# Patient Record
Sex: Female | Born: 1965 | Race: White | Hispanic: No | Marital: Married | Smoking: Never smoker
Health system: Southern US, Community
[De-identification: ages and names within clinical notes are randomized; demographics above are authoritative.]

## PROBLEM LIST (undated history)

## (undated) DIAGNOSIS — R609 Edema, unspecified: Secondary | ICD-10-CM

## (undated) DIAGNOSIS — I10 Essential (primary) hypertension: Secondary | ICD-10-CM

## (undated) DIAGNOSIS — R310 Gross hematuria: Secondary | ICD-10-CM

## (undated) DIAGNOSIS — E119 Type 2 diabetes mellitus without complications: Secondary | ICD-10-CM

## (undated) DIAGNOSIS — R350 Frequency of micturition: Secondary | ICD-10-CM

## (undated) DIAGNOSIS — K219 Gastro-esophageal reflux disease without esophagitis: Secondary | ICD-10-CM

## (undated) DIAGNOSIS — C801 Malignant (primary) neoplasm, unspecified: Secondary | ICD-10-CM

## (undated) HISTORY — DX: Edema, unspecified: R60.9

## (undated) HISTORY — DX: Malignant (primary) neoplasm, unspecified: C80.1

## (undated) HISTORY — DX: Essential (primary) hypertension: I10

## (undated) HISTORY — PX: OTHER SURGICAL HISTORY: SHX169

## (undated) HISTORY — DX: Gross hematuria: R31.0

## (undated) HISTORY — DX: Frequency of micturition: R35.0

## (undated) HISTORY — DX: Gastro-esophageal reflux disease without esophagitis: K21.9

## (undated) HISTORY — DX: Type 2 diabetes mellitus without complications: E11.9

---

## 1986-01-11 HISTORY — PX: CHOLECYSTECTOMY, LAPAROSCOPIC: SHX56

## 2007-01-12 HISTORY — PX: OTHER SURGICAL HISTORY: SHX169

## 2015-06-25 ENCOUNTER — Ambulatory Visit: Payer: Self-pay | Admitting: Cardiovascular Disease

## 2015-06-25 DIAGNOSIS — R310 Gross hematuria: Secondary | ICD-10-CM

## 2015-06-25 DIAGNOSIS — E119 Type 2 diabetes mellitus without complications: Secondary | ICD-10-CM

## 2015-06-25 DIAGNOSIS — K219 Gastro-esophageal reflux disease without esophagitis: Secondary | ICD-10-CM | POA: Insufficient documentation

## 2015-06-25 DIAGNOSIS — I10 Essential (primary) hypertension: Secondary | ICD-10-CM | POA: Insufficient documentation

## 2015-06-25 DIAGNOSIS — R609 Edema, unspecified: Secondary | ICD-10-CM | POA: Insufficient documentation

## 2015-06-26 ENCOUNTER — Encounter: Payer: Self-pay | Admitting: Cardiovascular Disease

## 2015-06-26 ENCOUNTER — Ambulatory Visit (INDEPENDENT_AMBULATORY_CARE_PROVIDER_SITE_OTHER): Payer: Managed Care, Other (non HMO) | Admitting: Cardiovascular Disease

## 2015-06-26 ENCOUNTER — Telehealth: Payer: Self-pay | Admitting: Cardiovascular Disease

## 2015-06-26 VITALS — BP 150/80 | HR 103 | Ht 67.0 in | Wt 250.0 lb

## 2015-06-26 DIAGNOSIS — R072 Precordial pain: Secondary | ICD-10-CM

## 2015-06-26 DIAGNOSIS — I10 Essential (primary) hypertension: Secondary | ICD-10-CM | POA: Diagnosis not present

## 2015-06-26 NOTE — Patient Instructions (Signed)
Your physician recommends that you schedule a follow-up appointment in:  As needed   Your physician has requested that you have an echocardiogram. Echocardiography is a painless test that uses sound waves to create images of your heart. It provides your doctor with information about the size and shape of your heart and how well your heart's chambers and valves are working. This procedure takes approximately one hour. There are no restrictions for this procedure.     Your physician has requested that you have a lexiscan myoview. For further information please visit [email or web address]. Please follow instruction sheet, as given.       Thank you for choosing [facility] !

## 2015-06-26 NOTE — Progress Notes (Signed)
Cardiology Office Note   Date:  06/26/2015   ID:  Shannon Hawkins, DOB 09/20/1965, MRN [id number]  PCP:  Gar Ponto, MD  Cardiologist:   Jenkins Rouge, MD   No chief complaint on file.     History of Present Illness: Shannon Hawkins is a 50 y.o. female who presents for dyspnea, edema and fatigue. Recent polyuria with elevated BS  Long standing HTN.  Diet ok avoids salt. Has had LE edema for a few months. Recent hematuria to see Dr Jeffie Pollock tomorrow. Note made that UA had protein. She has had relative tachycardia at rest. Dyspnea with exertion. Atypical sharp migrating fleeting chest pain.  Pain not related to exertion or breathing No recent trauma Compliant with meds.    Labs 5/27  A1c 6.7 Cr .76 LDL 118 K 4.1 BNP 15.9 TSH 3.7    Past Medical History  Diagnosis Date  . Gross hematuria   . Urinary frequency   . GERD (gastroesophageal reflux disease)   . HTN (hypertension)   . Cancer ([place])     FAMILY HISTORY OF BLADDER CANCER  . Edema   . DM type 2 (diabetes mellitus, type 2) ([place])     No past surgical history on file.   Current Outpatient Prescriptions  Medication Sig Dispense Refill  . cetirizine-pseudoephedrine (ZYRTEC-D) 5-120 MG tablet Take 1 tablet by mouth daily. Reported on 06/26/2015    . furosemide (LASIX) 20 MG tablet Take 20 mg by mouth daily.    Marland Kitchen losartan (COZAAR) 50 MG tablet Take 50 mg by mouth daily.    . metFORMIN (GLUCOPHAGE) 500 MG tablet Take by mouth daily with breakfast.    . pantoprazole (PROTONIX) 40 MG tablet Take 40 mg by mouth daily.    . potassium chloride (KLOR-CON) 8 MEQ tablet Take 8 mEq by mouth daily.     No current facility-administered medications for this visit.    Allergies:   Duricef    Social History:  The patient  reports that she has never smoked. She does not have any smokeless tobacco history on file.  Lives near [place] but has appt here with roommate and works for [employer].  Sedentary Exercise limited by previous right  hip replacement and left foot surgery   Family History:  The patient's mom has bradycardia has aunt with CAD    ROS:  Please see the history of present illness.   Otherwise, review of systems are positive for none.   All other systems are reviewed and negative.    PHYSICAL EXAM: VS:  BP 150/80 mmHg  Pulse 103  Ht 5\' 7"  (1.702 m)  Wt 250 lb (113.399 kg)  BMI 39.15 kg/m2  SpO2 99% , BMI Body mass index is 39.15 kg/(m^2). Affect appropriate Healthy:  appears stated age 53: normal Neck supple with no adenopathy JVP normal no bruits no thyromegaly Lungs clear with no wheezing and good diaphragmatic motion Heart:  S1/S2 no murmur, no rub, gallop or click PMI normal Abdomen: benighn, BS positve, no tenderness, no AAA no bruit.  No HSM or HJR Distal pulses intact with no bruits No edema Neuro non-focal Skin warm and dry No muscular weakness    EKG:   06/26/15  SR rate 102  Low voltage    Recent Labs: No results found for requested labs within last 365 days.    Lipid Panel No results found for: CHOL, TRIG, HDL, CHOLHDL, VLDL, LDLCALC, LDLDIRECT    Wt Readings from Last 3 Encounters:  06/26/15 250 lb (113.399 kg)      Other studies Reviewed: Additional studies/ records that were reviewed today include: Primary care notes/ labs .    ASSESSMENT AND PLAN:  1.  Chest pain : atypical ECG low voltage due to body habitus cannot walk well do to hip and foot surgeries F/u lexiscan myovue 2. Dyspnea:  Likely due to deconditioning and weight. F/u echo 3. Edema:  BNP normal not likely related to heart ? Nephrotic syndrome encouraged her to get 24 hr Urine protein with urologist or primary 4. Tachycardia:  No arrythmia normal P wave check echo TSH normal likely related to obesity  5. DM  Discussed low carb diet.  Target hemoglobin A1c is 6.5 or less.  Continue current medications. 6. HTN:  Well controlled.  Continue current medications and low sodium Dash type diet.       Current medicines are reviewed at length with the patient today.  The patient does not have concerns regarding medicines.  The following changes have been made:  no change  Labs/ tests ordered today include: Lexiscan and echo needs 24 hr urine protein   No orders of the defined types were placed in this encounter.     Disposition:   FU with me PRN      Signed, Jenkins Rouge, MD  06/26/2015 3:01 PM    [facility] [address], [place], [place]  [address] Phone: [phone]; Fax: [phone]

## 2015-06-26 NOTE — Telephone Encounter (Signed)
Please schedule following test same day for patient lexi myo and echo

## 2015-06-27 ENCOUNTER — Ambulatory Visit (INDEPENDENT_AMBULATORY_CARE_PROVIDER_SITE_OTHER): Payer: Managed Care, Other (non HMO) | Admitting: Urology

## 2015-06-27 ENCOUNTER — Other Ambulatory Visit (HOSPITAL_COMMUNITY)
Admission: AD | Admit: 2015-06-27 | Discharge: 2015-06-27 | Disposition: A | Discharge status: Home / Self Care | Payer: Managed Care, Other (non HMO) | Source: Other Acute Inpatient Hospital | Attending: Urology | Admitting: Urology

## 2015-06-27 DIAGNOSIS — C679 Malignant neoplasm of bladder, unspecified: Secondary | ICD-10-CM | POA: Diagnosis not present

## 2015-06-27 DIAGNOSIS — R808 Other proteinuria: Secondary | ICD-10-CM | POA: Diagnosis not present

## 2015-06-27 DIAGNOSIS — N3946 Mixed incontinence: Secondary | ICD-10-CM

## 2015-06-27 DIAGNOSIS — R350 Frequency of micturition: Secondary | ICD-10-CM | POA: Diagnosis not present

## 2015-06-27 DIAGNOSIS — R3121 Asymptomatic microscopic hematuria: Secondary | ICD-10-CM

## 2015-06-27 LAB — URINALYSIS, ROUTINE W REFLEX MICROSCOPIC
Bilirubin Urine: NEGATIVE
Glucose, UA: NEGATIVE mg/dL
HGB URINE DIPSTICK: NEGATIVE
Ketones, ur: NEGATIVE mg/dL
Leukocytes, UA: NEGATIVE
NITRITE: NEGATIVE
Protein, ur: NEGATIVE mg/dL
pH: 5.5 (ref 5.0–8.0)

## 2015-07-01 ENCOUNTER — Other Ambulatory Visit: Payer: Self-pay | Admitting: Urology

## 2015-07-01 DIAGNOSIS — R3121 Asymptomatic microscopic hematuria: Secondary | ICD-10-CM

## 2015-07-09 ENCOUNTER — Encounter (HOSPITAL_COMMUNITY): Admission: RE | Admit: 2015-07-09 | Payer: Managed Care, Other (non HMO) | Source: Ambulatory Visit

## 2015-07-09 ENCOUNTER — Ambulatory Visit (HOSPITAL_COMMUNITY): Payer: Managed Care, Other (non HMO) | Attending: Cardiovascular Disease

## 2015-07-09 ENCOUNTER — Encounter (HOSPITAL_COMMUNITY): Payer: Managed Care, Other (non HMO)

## 2015-07-09 ENCOUNTER — Inpatient Hospital Stay (HOSPITAL_COMMUNITY): Admission: RE | Admit: 2015-07-09 | Payer: Managed Care, Other (non HMO) | Source: Ambulatory Visit

## 2015-07-09 ENCOUNTER — Encounter (HOSPITAL_COMMUNITY): Payer: Managed Care, Other (non HMO) | Attending: Cardiovascular Disease

## 2015-08-08 ENCOUNTER — Other Ambulatory Visit: Payer: Self-pay | Admitting: Urology

## 2015-08-08 DIAGNOSIS — D4102 Neoplasm of uncertain behavior of left kidney: Secondary | ICD-10-CM

## 2015-08-14 ENCOUNTER — Encounter (HOSPITAL_COMMUNITY): Payer: Self-pay | Admitting: Radiology

## 2015-08-14 ENCOUNTER — Ambulatory Visit (HOSPITAL_COMMUNITY)
Admission: RE | Admit: 2015-08-14 | Discharge: 2015-08-14 | Disposition: A | Discharge status: Home / Self Care | Payer: Managed Care, Other (non HMO) | Source: Ambulatory Visit | Attending: Urology | Admitting: Urology

## 2015-08-14 DIAGNOSIS — D4102 Neoplasm of uncertain behavior of left kidney: Secondary | ICD-10-CM | POA: Diagnosis not present

## 2015-08-14 DIAGNOSIS — N281 Cyst of kidney, acquired: Secondary | ICD-10-CM | POA: Diagnosis not present

## 2015-08-14 LAB — POCT I-STAT CREATININE: CREATININE: 0.8 mg/dL (ref 0.44–1.00)

## 2015-08-14 MED ORDER — GADOBENATE DIMEGLUMINE 529 MG/ML IV SOLN
20.0000 mL | Freq: Once | INTRAVENOUS | Status: AC | PRN
  Administered 2015-08-14: 20 mL via INTRAVENOUS

## 2015-08-15 ENCOUNTER — Ambulatory Visit (INDEPENDENT_AMBULATORY_CARE_PROVIDER_SITE_OTHER): Payer: Managed Care, Other (non HMO) | Admitting: Urology

## 2015-08-15 DIAGNOSIS — R3121 Asymptomatic microscopic hematuria: Secondary | ICD-10-CM

## 2015-08-15 DIAGNOSIS — Q618 Other cystic kidney diseases: Secondary | ICD-10-CM | POA: Diagnosis not present

## 2015-08-26 ENCOUNTER — Ambulatory Visit (INDEPENDENT_AMBULATORY_CARE_PROVIDER_SITE_OTHER): Payer: Managed Care, Other (non HMO) | Admitting: Urology

## 2015-08-26 ENCOUNTER — Inpatient Hospital Stay (HOSPITAL_COMMUNITY): Admission: RE | Admit: 2015-08-26 | Payer: Managed Care, Other (non HMO) | Source: Ambulatory Visit

## 2015-08-26 ENCOUNTER — Encounter (HOSPITAL_COMMUNITY): Payer: Self-pay

## 2015-08-26 ENCOUNTER — Encounter (HOSPITAL_COMMUNITY)
Admission: RE | Admit: 2015-08-26 | Discharge: 2015-08-26 | Disposition: A | Discharge status: Home / Self Care | Payer: Managed Care, Other (non HMO) | Source: Ambulatory Visit | Attending: Cardiovascular Disease | Admitting: Cardiovascular Disease

## 2015-08-26 ENCOUNTER — Ambulatory Visit (HOSPITAL_COMMUNITY)
Admission: RE | Admit: 2015-08-26 | Discharge: 2015-08-26 | Disposition: A | Discharge status: Home / Self Care | Payer: Managed Care, Other (non HMO) | Source: Ambulatory Visit | Attending: Cardiovascular Disease | Admitting: Cardiovascular Disease

## 2015-08-26 DIAGNOSIS — I259 Chronic ischemic heart disease, unspecified: Secondary | ICD-10-CM | POA: Diagnosis not present

## 2015-08-26 DIAGNOSIS — E669 Obesity, unspecified: Secondary | ICD-10-CM | POA: Diagnosis not present

## 2015-08-26 DIAGNOSIS — Z6839 Body mass index (BMI) 39.0-39.9, adult: Secondary | ICD-10-CM | POA: Diagnosis not present

## 2015-08-26 DIAGNOSIS — K219 Gastro-esophageal reflux disease without esophagitis: Secondary | ICD-10-CM | POA: Insufficient documentation

## 2015-08-26 DIAGNOSIS — I119 Hypertensive heart disease without heart failure: Secondary | ICD-10-CM | POA: Insufficient documentation

## 2015-08-26 DIAGNOSIS — I358 Other nonrheumatic aortic valve disorders: Secondary | ICD-10-CM | POA: Diagnosis not present

## 2015-08-26 DIAGNOSIS — R931 Abnormal findings on diagnostic imaging of heart and coronary circulation: Secondary | ICD-10-CM | POA: Insufficient documentation

## 2015-08-26 DIAGNOSIS — R3915 Urgency of urination: Secondary | ICD-10-CM | POA: Diagnosis not present

## 2015-08-26 DIAGNOSIS — R350 Frequency of micturition: Secondary | ICD-10-CM | POA: Diagnosis not present

## 2015-08-26 DIAGNOSIS — R072 Precordial pain: Secondary | ICD-10-CM

## 2015-08-26 DIAGNOSIS — E119 Type 2 diabetes mellitus without complications: Secondary | ICD-10-CM | POA: Insufficient documentation

## 2015-08-26 DIAGNOSIS — I10 Essential (primary) hypertension: Secondary | ICD-10-CM

## 2015-08-26 DIAGNOSIS — R079 Chest pain, unspecified: Secondary | ICD-10-CM | POA: Diagnosis not present

## 2015-08-26 DIAGNOSIS — I059 Rheumatic mitral valve disease, unspecified: Secondary | ICD-10-CM | POA: Insufficient documentation

## 2015-08-26 LAB — NM MYOCAR MULTI W/SPECT W/WALL MOTION / EF
CHL CUP RESTING HR STRESS: 81 {beats}/min
CSEPPHR: 113 {beats}/min
LV dias vol: 63 mL (ref 46–106)
LVSYSVOL: 14 mL
RATE: 0.3
SDS: 11
SRS: 7
SSS: 18
TID: 0.99

## 2015-08-26 LAB — ECHOCARDIOGRAM COMPLETE
AVLVOTPG: 5 mmHg
CHL CUP MV DEC (S): 243
CHL CUP STROKE VOLUME: 37 mL
EERAT: 6.23
EWDT: 243 ms
FS: 32 % (ref 28–44)
IVS/LV PW RATIO, ED: 0.96
LA diam end sys: 26 mm
LA diam index: 1.1 cm/m2
LA vol A4C: 43.3 ml
LA vol: 44.3 mL
LASIZE: 26 mm
LAVOLIN: 18.7 mL/m2
LV E/e' medial: 6.23
LV PW d: 11.4 mm — AB (ref 0.6–1.1)
LV SIMPSON'S DISK: 64
LV TDI E'MEDIAL: 7.51
LV dias vol index: 24 mL/m2
LV e' LATERAL: 13.1 cm/s
LVDIAVOL: 58 mL (ref 46–106)
LVEEAVG: 6.23
LVOT VTI: 23.3 cm
LVOT area: 2.54 cm2
LVOTD: 18 mm
LVOTPV: 110 cm/s
LVOTSV: 59 mL
LVSYSVOL: 21 mL (ref 14–42)
LVSYSVOLIN: 9 mL/m2
MV Peak grad: 3 mmHg
MV pk A vel: 77.8 m/s
MV pk E vel: 81.6 m/s
RV LATERAL S' VELOCITY: 12.9 cm/s
RV TAPSE: 24.9 mm
TDI e' lateral: 13.1

## 2015-08-26 MED ORDER — REGADENOSON 0.4 MG/5ML IV SOLN
INTRAVENOUS | Status: AC
  Administered 2015-08-26: 0.4 mg via INTRAVENOUS
  Filled 2015-08-26: qty 5

## 2015-08-26 MED ORDER — TECHNETIUM TC 99M TETROFOSMIN IV KIT
10.0000 | PACK | Freq: Once | INTRAVENOUS | Status: AC | PRN
  Administered 2015-08-26: 10 via INTRAVENOUS

## 2015-08-26 MED ORDER — PERFLUTREN LIPID MICROSPHERE
1.0000 mL | INTRAVENOUS | Status: AC | PRN
  Administered 2015-08-26: 2 mL via INTRAVENOUS
  Administered 2015-08-26: 1 mL via INTRAVENOUS

## 2015-08-26 MED ORDER — TECHNETIUM TC 99M TETROFOSMIN IV KIT
30.0000 | PACK | Freq: Once | INTRAVENOUS | Status: AC | PRN
  Administered 2015-08-26: 30 via INTRAVENOUS

## 2015-08-26 MED ORDER — SODIUM CHLORIDE 0.9% FLUSH
INTRAVENOUS | Status: AC
  Administered 2015-08-26: 10 mL via INTRAVENOUS
  Filled 2015-08-26: qty 10

## 2015-08-26 NOTE — Progress Notes (Signed)
Echo completed. IV to left AC in place with good blood return. IV flushed with saline and remains in place for nuclear stress test later today. Coban applied to IV site. Tolerated well.

## 2015-08-26 NOTE — Progress Notes (Signed)
*  PRELIMINARY RESULTS* Echocardiogram 2D Echocardiogram has been performed with Definity.  Samuel Germany 08/26/2015, 9:46 AM

## 2015-08-26 NOTE — Progress Notes (Signed)
#  20 IV started left AC x1 stick, saline locked. drsg to site. Tolerated well.

## 2015-08-27 ENCOUNTER — Other Ambulatory Visit: Payer: Self-pay | Admitting: Cardiovascular Disease

## 2015-08-27 ENCOUNTER — Telehealth: Payer: Self-pay | Admitting: Cardiovascular Disease

## 2015-08-27 DIAGNOSIS — I2583 Coronary atherosclerosis due to lipid rich plaque: Principal | ICD-10-CM

## 2015-08-27 DIAGNOSIS — I2510 Atherosclerotic heart disease of native coronary artery without angina pectoris: Secondary | ICD-10-CM

## 2015-08-27 DIAGNOSIS — Z01812 Encounter for preprocedural laboratory examination: Secondary | ICD-10-CM

## 2015-08-27 NOTE — Telephone Encounter (Signed)
Spoke with patient about her instructions for heart cath.  Mailed instructions as well. Patient will come in on 09/08/15 for pre- procedure lab work. Patient verbalized understanding.

## 2015-08-27 NOTE — Telephone Encounter (Signed)
Follow Up:     . Returning your call, 

## 2015-08-27 NOTE — Telephone Encounter (Signed)
New Message ° °Pt is returning Pam's call.  ° °Please follow up with pt. Thanks! °

## 2015-08-27 NOTE — Telephone Encounter (Signed)
Left message for patient to call back  

## 2015-09-08 ENCOUNTER — Other Ambulatory Visit: Payer: Managed Care, Other (non HMO) | Admitting: *Deleted

## 2015-09-08 DIAGNOSIS — Z01812 Encounter for preprocedural laboratory examination: Secondary | ICD-10-CM

## 2015-09-08 LAB — CBC WITH DIFFERENTIAL/PLATELET
Basophils Absolute: 0 cells/uL (ref 0–200)
Basophils Relative: 0 %
EOS PCT: 2 %
Eosinophils Absolute: 222 cells/uL (ref 15–500)
HCT: 37.9 % (ref 35.0–45.0)
HEMOGLOBIN: 12.6 g/dL (ref 11.7–15.5)
LYMPHS ABS: 2442 {cells}/uL (ref 850–3900)
Lymphocytes Relative: 22 %
MCH: 28.4 pg (ref 27.0–33.0)
MCHC: 33.2 g/dL (ref 32.0–36.0)
MCV: 85.4 fL (ref 80.0–100.0)
MONOS PCT: 5 %
MPV: 9.2 fL (ref 7.5–12.5)
Monocytes Absolute: 555 cells/uL (ref 200–950)
NEUTROS ABS: 7881 {cells}/uL — AB (ref 1500–7800)
NEUTROS PCT: 71 %
PLATELETS: 434 10*3/uL — AB (ref 140–400)
RBC: 4.44 MIL/uL (ref 3.80–5.10)
RDW: 13.6 % (ref 11.0–15.0)
WBC: 11.1 10*3/uL — AB (ref 3.8–10.8)

## 2015-09-08 LAB — BASIC METABOLIC PANEL
BUN: 15 mg/dL (ref 7–25)
CALCIUM: 9.4 mg/dL (ref 8.6–10.2)
CO2: 26 mmol/L (ref 20–31)
CREATININE: 0.78 mg/dL (ref 0.50–1.10)
Chloride: 104 mmol/L (ref 98–110)
Glucose, Bld: 115 mg/dL — ABNORMAL HIGH (ref 65–99)
Potassium: 4.1 mmol/L (ref 3.5–5.3)
SODIUM: 140 mmol/L (ref 135–146)

## 2015-09-08 LAB — PROTIME-INR
INR: 0.9
PROTHROMBIN TIME: 9.8 s (ref 9.0–11.5)

## 2015-09-09 ENCOUNTER — Telehealth: Payer: Self-pay | Admitting: Cardiovascular Disease

## 2015-09-09 NOTE — Telephone Encounter (Signed)
Called patient with lab results.  

## 2015-09-09 NOTE — Telephone Encounter (Signed)
Follow Up:; ° ° °Returning your call. °

## 2015-09-12 ENCOUNTER — Encounter (HOSPITAL_COMMUNITY): Payer: Self-pay | Admitting: *Deleted

## 2015-09-12 ENCOUNTER — Encounter (HOSPITAL_COMMUNITY)
Admission: RE | Disposition: A | Discharge status: Home / Self Care | Payer: Self-pay | Source: Ambulatory Visit | Attending: Cardiovascular Disease

## 2015-09-12 ENCOUNTER — Ambulatory Visit (HOSPITAL_COMMUNITY)
Admission: RE | Admit: 2015-09-12 | Discharge: 2015-09-12 | Disposition: A | Discharge status: Home / Self Care | Payer: Managed Care, Other (non HMO) | Source: Ambulatory Visit | Attending: Cardiovascular Disease | Admitting: Cardiovascular Disease

## 2015-09-12 DIAGNOSIS — I2583 Coronary atherosclerosis due to lipid rich plaque: Secondary | ICD-10-CM

## 2015-09-12 DIAGNOSIS — I10 Essential (primary) hypertension: Secondary | ICD-10-CM | POA: Insufficient documentation

## 2015-09-12 DIAGNOSIS — R9439 Abnormal result of other cardiovascular function study: Secondary | ICD-10-CM | POA: Diagnosis present

## 2015-09-12 DIAGNOSIS — Z7984 Long term (current) use of oral hypoglycemic drugs: Secondary | ICD-10-CM | POA: Diagnosis not present

## 2015-09-12 DIAGNOSIS — R310 Gross hematuria: Secondary | ICD-10-CM | POA: Insufficient documentation

## 2015-09-12 DIAGNOSIS — R079 Chest pain, unspecified: Secondary | ICD-10-CM | POA: Diagnosis present

## 2015-09-12 DIAGNOSIS — E669 Obesity, unspecified: Secondary | ICD-10-CM | POA: Insufficient documentation

## 2015-09-12 DIAGNOSIS — Z96641 Presence of right artificial hip joint: Secondary | ICD-10-CM | POA: Insufficient documentation

## 2015-09-12 DIAGNOSIS — Z6841 Body Mass Index (BMI) 40.0 and over, adult: Secondary | ICD-10-CM | POA: Diagnosis not present

## 2015-09-12 DIAGNOSIS — K219 Gastro-esophageal reflux disease without esophagitis: Secondary | ICD-10-CM | POA: Diagnosis not present

## 2015-09-12 DIAGNOSIS — I2510 Atherosclerotic heart disease of native coronary artery without angina pectoris: Secondary | ICD-10-CM | POA: Diagnosis not present

## 2015-09-12 DIAGNOSIS — E119 Type 2 diabetes mellitus without complications: Secondary | ICD-10-CM | POA: Insufficient documentation

## 2015-09-12 HISTORY — PX: CARDIAC CATHETERIZATION: SHX172

## 2015-09-12 LAB — GLUCOSE, CAPILLARY
GLUCOSE-CAPILLARY: 111 mg/dL — AB (ref 65–99)
GLUCOSE-CAPILLARY: 108 mg/dL — AB (ref 65–99)

## 2015-09-12 SURGERY — LEFT HEART CATH AND CORONARY ANGIOGRAPHY

## 2015-09-12 MED ORDER — SODIUM CHLORIDE 0.9 % IV SOLN: 250.0000 mL | INTRAVENOUS | Status: DC | PRN

## 2015-09-12 MED ORDER — IOPAMIDOL (ISOVUE-370) INJECTION 76%
INTRAVENOUS | Status: AC
  Filled 2015-09-12: qty 100

## 2015-09-12 MED ORDER — SODIUM CHLORIDE 0.9% FLUSH: 3.0000 mL | INTRAVENOUS | Status: DC | PRN

## 2015-09-12 MED ORDER — FENTANYL CITRATE (PF) 100 MCG/2ML IJ SOLN
INTRAMUSCULAR | Status: AC
  Filled 2015-09-12: qty 2

## 2015-09-12 MED ORDER — LIDOCAINE HCL (PF) 1 % IJ SOLN
INTRAMUSCULAR | Status: DC | PRN
  Administered 2015-09-12: 2 mL via INTRADERMAL

## 2015-09-12 MED ORDER — ATORVASTATIN CALCIUM 40 MG PO TABS: 40.0000 mg | ORAL_TABLET | Freq: Every day | ORAL | 5 refills | Status: DC

## 2015-09-12 MED ORDER — SODIUM CHLORIDE 0.9 % WEIGHT BASED INFUSION
1.0000 mL/kg/h | INTRAVENOUS | Status: DC
  Administered 2015-09-12: 1 mL/kg/h via INTRAVENOUS

## 2015-09-12 MED ORDER — VERAPAMIL HCL 2.5 MG/ML IV SOLN
INTRAVENOUS | Status: DC | PRN
  Administered 2015-09-12: 10 mL via INTRA_ARTERIAL

## 2015-09-12 MED ORDER — SODIUM CHLORIDE 0.9% FLUSH: 3.0000 mL | Freq: Two times a day (BID) | INTRAVENOUS | Status: DC

## 2015-09-12 MED ORDER — HEPARIN (PORCINE) IN NACL 2-0.9 UNIT/ML-% IJ SOLN
INTRAMUSCULAR | Status: DC | PRN
  Administered 2015-09-12: 1000 mL

## 2015-09-12 MED ORDER — MIDAZOLAM HCL 2 MG/2ML IJ SOLN
INTRAMUSCULAR | Status: AC
  Filled 2015-09-12: qty 2

## 2015-09-12 MED ORDER — FENTANYL CITRATE (PF) 100 MCG/2ML IJ SOLN
INTRAMUSCULAR | Status: DC | PRN
  Administered 2015-09-12: 50 ug via INTRAVENOUS
  Administered 2015-09-12: 25 ug via INTRAVENOUS

## 2015-09-12 MED ORDER — VERAPAMIL HCL 2.5 MG/ML IV SOLN
INTRAVENOUS | Status: AC
  Filled 2015-09-12: qty 2

## 2015-09-12 MED ORDER — ASPIRIN 81 MG PO CHEW
CHEWABLE_TABLET | ORAL | Status: AC
  Filled 2015-09-12: qty 1

## 2015-09-12 MED ORDER — LIDOCAINE HCL (PF) 1 % IJ SOLN
INTRAMUSCULAR | Status: AC
  Filled 2015-09-12: qty 30

## 2015-09-12 MED ORDER — HEPARIN SODIUM (PORCINE) 1000 UNIT/ML IJ SOLN
INTRAMUSCULAR | Status: DC | PRN
  Administered 2015-09-12: 5000 [IU] via INTRAVENOUS

## 2015-09-12 MED ORDER — SODIUM CHLORIDE 0.9 % WEIGHT BASED INFUSION
3.0000 mL/kg/h | INTRAVENOUS | Status: DC
  Administered 2015-09-12: 3 mL/kg/h via INTRAVENOUS

## 2015-09-12 MED ORDER — HEPARIN (PORCINE) IN NACL 2-0.9 UNIT/ML-% IJ SOLN
INTRAMUSCULAR | Status: AC
  Filled 2015-09-12: qty 1000

## 2015-09-12 MED ORDER — ASPIRIN 81 MG PO CHEW
81.0000 mg | CHEWABLE_TABLET | ORAL | Status: AC
  Administered 2015-09-12: 81 mg via ORAL

## 2015-09-12 MED ORDER — SODIUM CHLORIDE 0.9 % IV SOLN: INTRAVENOUS | Status: DC

## 2015-09-12 MED ORDER — SODIUM CHLORIDE 0.9 % IV SOLN
250.0000 mL | INTRAVENOUS | Status: DC | PRN
Start: 2015-09-12 — End: 2015-09-12

## 2015-09-12 MED ORDER — IOPAMIDOL (ISOVUE-370) INJECTION 76%
INTRAVENOUS | Status: DC | PRN
  Administered 2015-09-12: 75 mL via INTRAVENOUS

## 2015-09-12 MED ORDER — MIDAZOLAM HCL 2 MG/2ML IJ SOLN
INTRAMUSCULAR | Status: DC | PRN
  Administered 2015-09-12 (×2): 1 mg via INTRAVENOUS

## 2015-09-12 SURGICAL SUPPLY — 11 items
CATH INFINITI 5FR ANG PIGTAIL (CATHETERS) ×2 IMPLANT
CATH OPTITORQUE TIG 4.0 5F (CATHETERS) ×2 IMPLANT
DEVICE RAD COMP TR BAND LRG (VASCULAR PRODUCTS) ×2 IMPLANT
GLIDESHEATH SLEND SS 6F .021 (SHEATH) ×2 IMPLANT
KIT HEART LEFT (KITS) ×2 IMPLANT
PACK CARDIAC CATHETERIZATION (CUSTOM PROCEDURE TRAY) ×2 IMPLANT
SYR MEDRAD MARK V 150ML (SYRINGE) ×2 IMPLANT
TRANSDUCER W/STOPCOCK (MISCELLANEOUS) ×2 IMPLANT
TUBING CIL FLEX 10 FLL-RA (TUBING) ×2 IMPLANT
WIRE HI TORQ VERSACORE-J 145CM (WIRE) ×2 IMPLANT
WIRE SAFE-T 1.5MM-J .035X260CM (WIRE) ×2 IMPLANT

## 2015-09-12 NOTE — Discharge Instructions (Signed)
°  Hold Metformin for 48 hours. May resume Mon. Morning.   Radial Site Care Refer to this sheet in the next few weeks. These instructions provide you with information about caring for yourself after your procedure. Your health care provider may also give you more specific instructions. Your treatment has been planned according to current medical practices, but problems sometimes occur. Call your health care provider if you have any problems or questions after your procedure. WHAT TO EXPECT AFTER THE PROCEDURE After your procedure, it is typical to have the following:  Bruising at the radial site that usually fades within 1-2 weeks.  Blood collecting in the tissue (hematoma) that may be painful to the touch. It should usually decrease in size and tenderness within 1-2 weeks. HOME CARE INSTRUCTIONS  Take medicines only as directed by your health care provider.  You may shower 24-48 hours after the procedure or as directed by your health care provider. Remove the bandage (dressing) and gently wash the site with plain soap and water. Pat the area dry with a clean towel. Do not rub the site, because this may cause bleeding.  Do not take baths, swim, or use a hot tub until your health care provider approves.  Check your insertion site every day for redness, swelling, or drainage.  Do not apply powder or lotion to the site.  Do not flex or bend the affected arm for 24 hours or as directed by your health care provider.  Do not push or pull heavy objects with the affected arm for 24 hours or as directed by your health care provider.  Do not lift over 10 lb (4.5 kg) for 5 days after your procedure or as directed by your health care provider.  Ask your health care provider when it is okay to:  Return to work or school.  Resume usual physical activities or sports.  Resume sexual activity.  Do not drive home if you are discharged the same day as the procedure. Have someone else drive  you.  You may drive 24 hours after the procedure unless otherwise instructed by your health care provider.  Do not operate machinery or power tools for 24 hours after the procedure.  If your procedure was done as an outpatient procedure, which means that you went home the same day as your procedure, a responsible adult should be with you for the first 24 hours after you arrive home.  Keep all follow-up visits as directed by your health care provider. This is important. SEEK MEDICAL CARE IF:  You have a fever.  You have chills.  You have increased bleeding from the radial site. Hold pressure on the site. SEEK IMMEDIATE MEDICAL CARE IF:  You have unusual pain at the radial site.  You have redness, warmth, or swelling at the radial site.  You have drainage (other than a small amount of blood on the dressing) from the radial site.  The radial site is bleeding, and the bleeding does not stop after 30 minutes of holding steady pressure on the site.  Your arm or hand becomes pale, cool, tingly, or numb.   This information is not intended to replace advice given to you by your health care provider. Make sure you discuss any questions you have with your health care provider.   Document Released: 01/30/2010 Document Revised: 01/18/2014 Document Reviewed: 07/16/2013 Elsevier Interactive Patient Education [employer].

## 2015-09-12 NOTE — H&P (Signed)
Cardiology History & Physical    Patient ID: Shannon Hawkins MRN: [id number], DOB: 1965/07/05 Date of Encounter: 09/12/2015, 11:07 AM Primary Physician: Gar Ponto, MD Primary Cardiologist: Jenkins Rouge, MD  Chief Complaint: Chest pain Reason for Catheterization: Chest pain  Requesting MD: Jenkins Rouge, MD  HPI: 50 year old woman with history of diabetes mellitus, hypertension, hematuria being evaluated by urology, and obesity, who was evaluated by Dr. Johnsie Cancel in June for episodic chest pain accompanied by shortness of breath and dizziness.  She notes these episodes have been happening sporadically for more than a year but seem to be increasing in frequency, now happening several times per week.  She describes her pain as a central stabbing with accompanying palpitations, dyspnea, and lightheadedness.  They last for several minutes and resolve spontaneously.  She does not know of any clear precipitants, with the symptoms frequently occurring when she is resting.  She underwent echocardiogram that revealed no significant abnormalities.  However, subsequent myocardial perfusion stress test this month revealed possible anterior anterolateral infarct with moderate peri-infarct ischemia as well as mild inferior wall ischemia with a normal EF.  Given her constellation of symptoms and abnormal stress test, the patient has been referred for left heart catheterization and possible PCI.  Of note, the patient reports to remote cardiac catheterizations that were normal by her report.  Past Medical History:  Diagnosis Date  . Cancer ([place])    FAMILY HISTORY OF BLADDER CANCER  . DM type 2 (diabetes mellitus, type 2) ([place])   . Edema   . GERD (gastroesophageal reflux disease)   . Gross hematuria   . HTN (hypertension)   . Urinary frequency      Surgical History:  Right total hip arthroplasty, right elbow surgery, and hysterectomy  Home Meds: Prior to Admission medications   Medication Sig Start Date  Brenton Joines Date Taking? Authorizing Provider  cetirizine-pseudoephedrine (ZYRTEC-D) 5-120 MG tablet Take 1 tablet by mouth daily. Reported on 06/26/2015   Yes Historical Provider, MD  diazepam (VALIUM) 5 MG tablet Take 5 mg by mouth every evening.   Yes Historical Provider, MD  furosemide (LASIX) 20 MG tablet Take 20 mg by mouth daily.   Yes Historical Provider, MD  losartan (COZAAR) 50 MG tablet Take 50 mg by mouth daily after breakfast.    Yes Historical Provider, MD  metFORMIN (GLUCOPHAGE) 500 MG tablet Take by mouth daily with breakfast.   Yes Historical Provider, MD  Meth-Hyo-M Bl-Na Phos-Ph Sal (URIBEL) 118 MG CAPS Take 118 mg by mouth every morning.   Yes Historical Provider, MD  mirabegron ER (MYRBETRIQ) 25 MG TB24 tablet Take 25 mg by mouth every morning.   Yes Historical Provider, MD  pantoprazole (PROTONIX) 40 MG tablet Take 40 mg by mouth every morning.    Yes Historical Provider, MD  potassium chloride (KLOR-CON) 8 MEQ tablet Take 8 mEq by mouth daily.   Yes Historical Provider, MD  Cholecalciferol (VITAMIN D3) 1000 units CAPS Take 1,000 Units by mouth daily.    Historical Provider, MD    Allergies:  Allergies  Allergen Reactions  . Duricef [Cefadroxil] Rash    Social History   Social History  . Marital status: Married    Spouse name: N/A  . Number of children: N/A  . Years of education: N/A   Occupational History  . Not on file.   Social History Main Topics  . Smoking status: Never Smoker  . Smokeless tobacco: Not on file  . Alcohol use Not on file  .  Drug use: Unknown  . Sexual activity: Not on file   Other Topics Concern  . Not on file   Social History Narrative  . No narrative on file     Family History  Problem Relation Age of Onset  . Bladder Cancer      family history     Review of Systems: A 12-system review of systems was performed and is negative except as noted in the HPI.  Labs:  Lab Results  Component Value Date   WBC 11.1 (H) 09/08/2015    HGB 12.6 09/08/2015   HCT 37.9 09/08/2015   MCV 85.4 09/08/2015   PLT 434 (H) 09/08/2015    Recent Labs Lab 09/08/15 1549  NA 140  K 4.1  CL 104  CO2 26  BUN 15  CREATININE 0.78  CALCIUM 9.4  GLUCOSE 115*   No results for input(s): CKTOTAL, CKMB, TROPONINI in the last 72 hours. No results found for: CHOL, HDL, LDLCALC, TRIG No results found for: DDIMER  Radiology/Studies:    Nm Myocar Multi W/spect W/wall Motion / Ef  Result Date: 08/26/2015  There was no ST segment deviation noted during stress.  The left ventricular ejection fraction is hyperdynamic (>65%).  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 There is evidence of mild inferior wall ischemia.  This is a high risk study. Multiple territories of ischemia.    Wt Readings from Last 3 Encounters:  09/12/15 112.9 kg (249 lb)  06/26/15 113.4 kg (250 lb)    EKG: Normal sinus rhythm without significant abnormalities.  Physical Exam: Blood pressure (!) 151/100, pulse 86, temperature 98.3 F (36.8 C), temperature source Oral, resp. rate 18, height 5\' 4"  (1.626 m), weight 112.9 kg (249 lb), SpO2 100 %. Body mass index is 42.74 kg/m. General: Obese woman, lying comfortably on stretcher. Head: Normocephalic, atraumatic, sclera non-icteric, no xanthomas, nares are without discharge.  Neck: JVD not elevated, though evaluation is limited by body habitus. Lungs: Clear bilaterally to auscultation without wheezes, rales, or rhonchi. Breathing is unlabored. Heart: RRR with S1 S2. No murmurs, rubs, or gallops appreciated. Abdomen: Soft, non-tender, non-distended with normoactive bowel sounds. No hepatomegaly. No rebound/guarding. No obvious abdominal masses. Msk:  Strength and tone appear normal for age. Extremities: No clubbing or cyanosis. No edema.  Radial, femoral, PT, and DP pulses are 2+ bilaterally. Neuro: Alert and oriented X 3. No focal deficit. No facial  asymmetry. Moves all extremities spontaneously. Psych:  Responds to questions appropriately with a normal affect.    Assessment and Plan  Ms. Remund is a 50 year old woman recurrent chest pain, palpitations, and shortness of breath in the setting of an abnormal, high-risk myocardial perfusion stress test, who has been referred for left heart catheterization and possible PCI.  I have reviewed the risks, indications, and alternatives to cardiac catheterization, possible angioplasty, and stenting with the patient. Risks include but are not limited to bleeding, infection, vascular injury, stroke, myocardial infection, arrhythmia, kidney injury, radiation-related injury in the case of prolonged fluoroscopy use, emergency cardiac surgery, and death. The patient understands the risks of serious complication is 1-2 in [id number] with diagnostic cardiac cath and 1-2% or less with angioplasty/stenting.  The patient has provided informed consent and wishes to proceed.  We will plan for a right radial artery approach.   Verlan Friends Nikkita Adeyemi MD 09/12/2015, 11:07 AM Pager: [phone]

## 2015-09-17 ENCOUNTER — Telehealth: Payer: Self-pay | Admitting: Cardiovascular Disease

## 2015-09-17 NOTE — Telephone Encounter (Signed)
Left message to call back  

## 2015-09-17 NOTE — Telephone Encounter (Signed)
New message       Pt had a heart cath last Friday.  She is calling to find out when she need to schedule a follow up appt

## 2015-09-18 ENCOUNTER — Encounter: Payer: Self-pay | Admitting: *Deleted

## 2015-09-18 NOTE — Telephone Encounter (Signed)
F/u ° ° ° ° ° °Pt returning nurse call.  °

## 2015-09-18 NOTE — Research (Signed)
CADLAD Informed Consent   Subject Name: Shannon Hawkins  Subject met inclusion and exclusion criteria.  The informed consent form, study requirements and expectations were reviewed with the subject and questions and concerns were addressed prior to the signing of the consent form.  The subject verbalized understanding of the trail requirements.  The subject agreed to participate in the CADLAD trial and signed the informed consent.  The informed consent was obtained prior to performance of any protocol-specific procedures for the subject.  A copy of the signed informed consent was given to the subject and a copy was placed in the subject's medical record.  Berneda Rose 09/18/2015, 1:24 PM

## 2015-09-18 NOTE — Telephone Encounter (Signed)
Left message for patient to call back  

## 2015-09-19 NOTE — Telephone Encounter (Signed)
Follow Up:; ° ° °Returning your call. °

## 2015-09-19 NOTE — Telephone Encounter (Signed)
Called patient and made an appointment with a PA for follow-up visit after heart cath.

## 2015-09-19 NOTE — Telephone Encounter (Signed)
Left message for patient to call back  

## 2015-10-03 ENCOUNTER — Ambulatory Visit (INDEPENDENT_AMBULATORY_CARE_PROVIDER_SITE_OTHER): Payer: Managed Care, Other (non HMO) | Admitting: Urology

## 2015-10-03 ENCOUNTER — Ambulatory Visit: Payer: Managed Care, Other (non HMO) | Admitting: Urology

## 2015-10-03 DIAGNOSIS — N3946 Mixed incontinence: Secondary | ICD-10-CM

## 2015-10-03 DIAGNOSIS — R350 Frequency of micturition: Secondary | ICD-10-CM | POA: Diagnosis not present

## 2015-10-04 NOTE — Progress Notes (Deleted)
Cardiology Office Note    Date:  10/04/2015   ID:  Shannon Hawkins, DOB 1965/01/14, MRN [id number]  PCP:  Gar Ponto, MD  Cardiologist:  Dr. Johnsie Cancel  CC: post cath follow up  History of Present Illness:  Shannon Hawkins is a 50 y.o. female with a history of HTN, DMT2, obesity, and recently diagnosed non obstructive CAD who presents to clinic for post cath follow up.   She established care with Dr. Johnsie Cancel in 06/2015 for evaluation of LE edema, DOE and chest pain.   Labs 5/27  A1c 6.7 Cr .76 LDL 118 K 4.1 BNP 15.9 TSH 3.7  Given her normal BNP. Dr. Johnsie Cancel felt LE edema was non cardiac. He encouraged a 24 hour urine with PCP to r/o nephrotic syndrome. He ordered a 2D ECHO 08/26/15 which showed normal LV function and AV sclerosis with no stenosis. Lexiscan myoview 08/26/15 was abnormal showing multiple areas of possible ischemia. She was referred for coronary angiography on 09/12/15 which showed mild non obstructive CAD in LAD and RCA that did not correlate with stress test findings. Medical therapy was recommended and she was started on a statin to prevent further progression of her CAD. Her chest pain, palpitations and SOB were not felt to be related to her heart.   Today she presents to clinic for follow up.  Past Medical History:  Diagnosis Date  . Cancer ([place])    FAMILY HISTORY OF BLADDER CANCER  . DM type 2 (diabetes mellitus, type 2) ([place])   . Edema   . GERD (gastroesophageal reflux disease)   . Gross hematuria   . HTN (hypertension)   . Urinary frequency     Past Surgical History:  Procedure Laterality Date  . CARDIAC CATHETERIZATION N/A 09/12/2015   Procedure: Left Heart Cath and Coronary Angiography;  Surgeon: Nelva Bush, MD;  Location: [facility];  Service: Cardiovascular;  Laterality: N/A;    Current Medications: Outpatient Medications Prior to Visit  Medication Sig Dispense Refill  . atorvastatin (LIPITOR) 40 MG tablet Take 1 tablet (40 mg total) by mouth  daily. 30 tablet 5  . cetirizine-pseudoephedrine (ZYRTEC-D) 5-120 MG tablet Take 1 tablet by mouth daily. Reported on 06/26/2015    . Cholecalciferol (VITAMIN D3) 1000 units CAPS Take 1,000 Units by mouth daily.    . diazepam (VALIUM) 5 MG tablet Take 5 mg by mouth every evening.    . furosemide (LASIX) 20 MG tablet Take 20 mg by mouth daily.    Marland Kitchen losartan (COZAAR) 50 MG tablet Take 50 mg by mouth daily after breakfast.     . metFORMIN (GLUCOPHAGE) 500 MG tablet Take by mouth daily with breakfast.    . Meth-Hyo-M Bl-Na Phos-Ph Sal (URIBEL) 118 MG CAPS Take 118 mg by mouth every morning.    . mirabegron ER (MYRBETRIQ) 25 MG TB24 tablet Take 25 mg by mouth every morning.    . pantoprazole (PROTONIX) 40 MG tablet Take 40 mg by mouth every morning.     . potassium chloride (KLOR-CON) 8 MEQ tablet Take 8 mEq by mouth daily.     No facility-administered medications prior to visit.      Allergies:   Duricef [cefadroxil]   Social History   Social History  . Marital status: Married    Spouse name: N/A  . Number of children: N/A  . Years of education: N/A   Social History Main Topics  . Smoking status: Never Smoker  . Smokeless tobacco: Not on file  .  Alcohol use Not on file  . Drug use: Unknown  . Sexual activity: Not on file   Other Topics Concern  . Not on file   Social History Narrative  . No narrative on file     Family History:  The patient's ***family history is not on file.     ROS:   Please see the history of present illness.    ROS All other systems reviewed and are negative.   PHYSICAL EXAM:   VS:  There were no vitals taken for this visit.   GEN: Well nourished, well developed, in no acute distress  HEENT: normal  Neck: no JVD, carotid bruits, or masses Cardiac: ***RRR; no murmurs, rubs, or gallops,no edema  Respiratory:  clear to auscultation bilaterally, normal work of breathing GI: soft, nontender, nondistended, + BS MS: no deformity or atrophy  Skin: warm  and dry, no rash Neuro:  Alert and Oriented x 3, Strength and sensation are intact Psych: euthymic mood, full affect  Wt Readings from Last 3 Encounters:  09/12/15 249 lb (112.9 kg)  06/26/15 250 lb (113.4 kg)      Studies/Labs Reviewed:   EKG:  EKG is*** ordered today.  The ekg ordered today demonstrates ***  Recent Labs: 09/08/2015: BUN 15; Creat 0.78; Hemoglobin 12.6; Platelets 434; Potassium 4.1; Sodium 140   Lipid Panel No results found for: CHOL, TRIG, HDL, CHOLHDL, VLDL, LDLCALC, LDLDIRECT  Additional studies/ records that were reviewed today include:  08/26/15 Myoview Study Result   There was no ST segment deviation noted during stress.  The left ventricular ejection fraction is hyperdynamic (>65%).  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 There is evidence of mild inferior wall ischemia.  This is a high risk study. Multiple territories of ischemia.   2D ECHO: 08/26/2015 LV EF: 60% -   65% Study Conclusions - Left ventricle: The cavity size was normal. Wall thickness was   increased in a pattern of mild LVH. Systolic function was normal.   The estimated ejection fraction was in the range of 60% to 65%.   Wall motion was normal; there were no regional wall motion   abnormalities. Left ventricular diastolic function parameters   were normal. - Aortic valve: Mildly calcified annulus. Trileaflet; normal   thickness leaflets. Valve area (VTI): 2.06 cm^2. Valve area   (Vmax): 1.83 cm^2. Valve area (Vmean): 1.82 cm^2. - Mitral valve: Mildly calcified annulus. Normal thickness leaflets - Atrial septum: No defect or patent foramen ovale was identified. - Technically difficult study. Echocontrast was used to enhance   visualization.  09/12/15 Left Heart Cath and Coronary Angiography  Conclusion   1. 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  The angiographic appearance of the  disease does not correlate with the stress test findings. 2. Normal to hyperdynamic left ventricular contraction with upper normal filling pressure.  Recommendations: 1. Medical therapy and evaluation for other potential causes of the patient's chest pain, palpitations, and shortness of breath. 2. Will initiate atorvastatin 40 mg daily to prevent progression of CAD.  LDL in 04/2015 was 118. 3. Follow-up with Dr. Johnsie Cancel as an outpatient.     ASSESSMENT & PLAN:  Sumitra Deckert is a 50 y.o. female with a history of HTN, DMT2, obesity, and recently diagnosed non obstructive CAD who presents to clinic for post cath follow up.   She established care with Dr. Johnsie Cancel in 06/2015 for evaluation of LE edema, DOE and chest pain.  Labs 5/27  A1c 6.7 Cr .76 LDL 118 K 4.1 BNP 15.9 TSH 3.7  Given her normal BNP. Dr. Johnsie Cancel felt LE edema was non cardiac. He encouraged a 24 hour urine with PCP to r/o nephrotic syndrome. He ordered a 2D ECHO 08/26/15 which showed normal LV function and AV sclerosis with no stenosis. Lexiscan myoview 08/26/15 was abnormal showing multiple areas of possible ischemia. She was referred for coronary angiography on 09/12/15 which showed mild non obstructive CAD in LAD and RCA that did not correlate with stress test findings. Medical therapy was recommended and she was started on a statin to prevent further progression of her CAD. Her chest pain, palpitations and SOB were not felt to be related to her heart.   Today she presents to clinic for follow up. Dyspnea: likely related to obesity.   Obesity: diet and weight loss recommended.   HTN:BP well control  CAD: non obstructive CAD. Continue ASA, statin and BB.   HLD: LDL 118. Recently started on a statin   Medication Adjustments/Labs and Tests Ordered: Current medicines are reviewed at length with the patient today.  Concerns regarding medicines are outlined above.  Medication changes, Labs and Tests ordered today are listed in the  Patient Instructions below. There are no Patient Instructions on file for this visit.   Signed, Angelena Form, PA-C  10/04/2015 3:30 PM    [facility] [address], [place], [place]  [address] Phone: [phone]; Fax: [phone]

## 2015-10-07 ENCOUNTER — Ambulatory Visit: Payer: Managed Care, Other (non HMO) | Admitting: Physician Assistant

## 2015-10-09 NOTE — Progress Notes (Signed)
Cardiology Office Note    Date:  10/10/2015   ID:  Shannon Hawkins, DOB 1965/07/13, MRN [id number]  PCP:  Gar Ponto, MD  Cardiologist: Dr. Johnsie Cancel   CC: post cath follow up   History of Present Illness:  Shannon Hawkins is a 50 y.o. female with a history of HTN, DMT2, obesity, and recently diagnosed non obstructive CAD who presents to clinic for post cath follow up.   She established care with Dr. Johnsie Cancel in 06/2015 for evaluation of LE edema, DOE and chest pain.   Labs 5/27 A1c 6.7 Cr .76 LDL 118 K 4.1 BNP 15.9 TSH 3.7   Given her normal BNP. Dr. Johnsie Cancel felt LE edema was non cardiac. He encouraged a 24 hour urine with PCP to r/o nephrotic syndrome. He ordered a 2D ECHO 08/26/15 which showed normal LV function and AV sclerosis with no stenosis. Lexiscan myoview 08/26/15 was abnormal showing multiple areas of possible ischemia. She was referred for coronary angiography on 09/12/15 which showed mild non obstructive CAD in LAD and RCA that did not correlate with stress test findings. Medical therapy was recommended and she was started on a statin to prevent further progression of her CAD. Her chest pain, palpitations and SOB were not felt to be related to her heart.   Today she presents to clinic for follow up. She has continued to have some chest pain that is sharp and intermittent. Chest pain is not related to eating. Still have some SOB with activity. Still having some LE edema. She did have a 24 hour urine which did find some protein in her urine. She is seeing Dr. Jeffie Pollock with urology. CT of abdomen did show a lesion on her kidney for which she is getting a follow up MRI. Her father did have kidney and bladder cancer. No orthopnea or PND. No dizziness or syncope. She has been having some HAs at night which she thinks may be related to lipitor.     Past Medical History:  Diagnosis Date  . Cancer ([place])    FAMILY HISTORY OF BLADDER CANCER  . DM type 2 (diabetes mellitus, type 2) ([place])   . Edema    . GERD (gastroesophageal reflux disease)   . Gross hematuria   . HTN (hypertension)   . Urinary frequency     Past Surgical History:  Procedure Laterality Date  . CARDIAC CATHETERIZATION N/A 09/12/2015   Procedure: Left Heart Cath and Coronary Angiography;  Surgeon: Nelva Bush, MD;  Location: [facility];  Service: Cardiovascular;  Laterality: N/A;    Current Medications: Outpatient Medications Prior to Visit  Medication Sig Dispense Refill  . atorvastatin (LIPITOR) 40 MG tablet Take 1 tablet (40 mg total) by mouth daily. 30 tablet 5  . cetirizine-pseudoephedrine (ZYRTEC-D) 5-120 MG tablet Take 1 tablet by mouth daily. Reported on 06/26/2015    . Cholecalciferol (VITAMIN D3) 1000 units CAPS Take 1,000 Units by mouth daily.    . diazepam (VALIUM) 5 MG tablet Take 5 mg by mouth daily as needed.     . furosemide (LASIX) 20 MG tablet Take 20 mg by mouth daily.    Marland Kitchen losartan (COZAAR) 50 MG tablet Take 50 mg by mouth daily after breakfast.     . metFORMIN (GLUCOPHAGE) 500 MG tablet Take by mouth daily with breakfast.    . mirabegron ER (MYRBETRIQ) 25 MG TB24 tablet Take 25 mg by mouth every morning.    . pantoprazole (PROTONIX) 40 MG tablet Take 40 mg by  mouth every morning.     . potassium chloride (KLOR-CON) 8 MEQ tablet Take 8 mEq by mouth daily.    . Meth-Hyo-M Bl-Na Phos-Ph Sal (URIBEL) 118 MG CAPS Take 118 mg by mouth every morning.     No facility-administered medications prior to visit.      Allergies:   Duricef [cefadroxil]   Social History   Social History  . Marital status: Married    Spouse name: N/A  . Number of children: N/A  . Years of education: N/A   Social History Main Topics  . Smoking status: Never Smoker  . Smokeless tobacco: Not on file  . Alcohol use Not on file  . Drug use: Unknown  . Sexual activity: Not on file   Other Topics Concern  . Not on file   Social History Narrative  . No narrative on file     Family History:  The patient's  father had kidney, bladder cancer and leukemia.     ROS:   Please see the history of present illness.    ROS All other systems reviewed and are negative.   PHYSICAL EXAM:   VS:  BP 122/82   Pulse 86   Ht 5\' 4"  (1.626 m)   Wt 248 lb (112.5 kg)   BMI 42.57 kg/m    GEN: Well nourished, well developed, in no acute distress, obese HEENT: normal  Neck: no JVD, carotid bruits, or masses Cardiac: RRR; no murmurs, rubs, or gallops,no edema  Respiratory:  clear to auscultation bilaterally, normal work of breathing GI: soft, nontender, nondistended, + BS MS: no deformity or atrophy  Skin: warm and dry, no rash Neuro:  Alert and Oriented x 3, Strength and sensation are intact Psych: euthymic mood, full affect  Wt Readings from Last 3 Encounters:  10/10/15 248 lb (112.5 kg)  09/12/15 249 lb (112.9 kg)  06/26/15 250 lb (113.4 kg)      Studies/Labs Reviewed:   EKG:  EKG is NOT ordered today.    Recent Labs: 09/08/2015: BUN 15; Creat 0.78; Hemoglobin 12.6; Platelets 434; Potassium 4.1; Sodium 140   Lipid Panel No results found for: CHOL, TRIG, HDL, CHOLHDL, VLDL, LDLCALC, LDLDIRECT  Additional studies/ records that were reviewed today include:  2D ECHO: 08/26/2015 LV EF: 60% -   65% Study Conclusions - Left ventricle: The cavity size was normal. Wall thickness was   increased in a pattern of mild LVH. Systolic function was normal.   The estimated ejection fraction was in the range of 60% to 65%.   Wall motion was normal; there were no regional wall motion   abnormalities. Left ventricular diastolic function parameters   were normal. - Aortic valve: Mildly calcified annulus. Trileaflet; normal   thickness leaflets. Valve area (VTI): 2.06 cm^2. Valve area   (Vmax): 1.83 cm^2. Valve area (Vmean): 1.82 cm^2. - Mitral valve: Mildly calcified annulus. Normal thickness leaflets - Atrial septum: No defect or patent foramen ovale was identified. - Technically difficult study.  Echocontrast was used to enhance   visualization.  LHC 09/12/15 Conclusion  1. 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  The angiographic appearance of the disease does not correlate with the stress test findings. 2. Normal to hyperdynamic left ventricular contraction with upper normal filling pressure.  Recommendations: 1. Medical therapy and evaluation for other potential causes of the patient's chest pain, palpitations, and shortness of breath. 2. Will initiate atorvastatin 40 mg daily to prevent progression of CAD.  LDL in  04/2015 was 118. 3. Follow-up with Dr. Johnsie Cancel as an outpatient.     ASSESSMENT & PLAN:   Dyspnea: likely related to obesity and deconditioning. I recommended diet and exercise  Morbid Obesity: Body mass index is 42.57 kg/m.  HTN: BP well controlled  CAD: non obstructive CAD. Continue statin. Will start ASA 81mg  daily    HLD: LDL 118. Recently started on a atorva 40mg  daily  Proteinuria and kidney mass: being followed by urology now. Nephrotic syndrome likely the cause of LE edema.     Medication Adjustments/Labs and Tests Ordered: Current medicines are reviewed at length with the patient today.  Concerns regarding medicines are outlined above.  Medication changes, Labs and Tests ordered today are listed in the Patient Instructions below. Patient Instructions  Medication Instructions:  Your physician has recommended you make the following change in your medication:  1-Aspirin 81 mg by mouth daily.  Labwork: NONE  Testing/Procedures: NONE  Follow-Up: Your physician wants you to follow-up in: 12 months with Dr. Johnsie Cancel. You will receive a reminder letter in the mail two months in advance. If you don't receive a letter, please call our office to schedule the follow-up appointment.   If you need a refill on your cardiac medications before your next appointment, please call your pharmacy.       Signed, Angelena Form, PA-C  10/10/2015 2:42 PM    [facility] [place], [place], [place]  [address] Phone: [phone]; Fax: [phone]

## 2015-10-10 ENCOUNTER — Ambulatory Visit (INDEPENDENT_AMBULATORY_CARE_PROVIDER_SITE_OTHER): Payer: Managed Care, Other (non HMO) | Admitting: Physician Assistant

## 2015-10-10 ENCOUNTER — Ambulatory Visit: Payer: Managed Care, Other (non HMO) | Admitting: Urology

## 2015-10-10 VITALS — BP 122/82 | HR 86 | Ht 64.0 in | Wt 248.0 lb

## 2015-10-10 DIAGNOSIS — R0789 Other chest pain: Secondary | ICD-10-CM | POA: Diagnosis not present

## 2015-10-10 DIAGNOSIS — I10 Essential (primary) hypertension: Secondary | ICD-10-CM

## 2015-10-10 DIAGNOSIS — E785 Hyperlipidemia, unspecified: Secondary | ICD-10-CM

## 2015-10-10 DIAGNOSIS — I2510 Atherosclerotic heart disease of native coronary artery without angina pectoris: Secondary | ICD-10-CM

## 2015-10-10 DIAGNOSIS — R809 Proteinuria, unspecified: Secondary | ICD-10-CM

## 2015-10-10 MED ORDER — ASPIRIN EC 81 MG PO TBEC: 81.0000 mg | DELAYED_RELEASE_TABLET | Freq: Every day | ORAL | 3 refills | Status: AC

## 2015-10-10 NOTE — Patient Instructions (Signed)
Medication Instructions:  Your physician has recommended you make the following change in your medication:  1-Aspirin 81 mg by mouth daily.  Labwork: NONE  Testing/Procedures: NONE  Follow-Up: Your physician wants you to follow-up in: 12 months with Dr. Johnsie Cancel. You will receive a reminder letter in the mail two months in advance. If you don't receive a letter, please call our office to schedule the follow-up appointment.   If you need a refill on your cardiac medications before your next appointment, please call your pharmacy.

## 2015-11-17 NOTE — Progress Notes (Signed)
Cardiology Office Note    Date:  11/18/2015   ID:  Shannon Hawkins, DOB 01-Feb-1965, MRN [id number]  PCP:  Gar Ponto, MD  Cardiologist: Dr. Johnsie Cancel   CC:  Chest Pain   History of Present Illness:  Shannon Hawkins is a 50 y.o. female with a history of HTN, DMT2, obesity, and recently diagnosed non obstructive CAD who presents to clinic for post cath follow up.   She established care with me in 06/2015 for evaluation of LE edema, DOE and chest pain.   Labs 5/27 A1c 6.7 Cr .76 LDL 118 K 4.1 BNP 15.9 TSH 3.7   Given her normal BNP. felt LE edema was non cardiac. He encouraged a 24 hour urine with PCP to r/o nephrotic syndrome. He ordered a 2D ECHO 08/26/15 which showed normal LV function and AV sclerosis with no stenosis. Lexiscan myoview 08/26/15 was abnormal showing multiple areas of possible ischemia. She was referred for coronary angiography on 09/12/15 which showed mild non obstructive CAD in LAD and RCA that did not correlate with stress test findings. Medical therapy was recommended and she was started on a statin to prevent further progression of her CAD. Her chest pain, palpitations and SOB were not felt to be related to her heart.   Still with some edema . She did have a 24 hour urine which did find some protein in her urine. She is seeing Dr. Jeffie Pollock with urology. CT of abdomen did show a lesion on her kidney MRI suggested 1.6 cm left interpolar lesion ? Bosniak 72F  Her father did have kidney and bladder cancer. No orthopnea or PND. No dizziness or syncope.   Has had a couple of diaphoretic episodes with nausea last couple of days GB is already gone   Past Medical History:  Diagnosis Date  . Cancer ([place])    FAMILY HISTORY OF BLADDER CANCER  . DM type 2 (diabetes mellitus, type 2) ([place])   . Edema   . GERD (gastroesophageal reflux disease)   . Gross hematuria   . HTN (hypertension)   . Urinary frequency     Past Surgical History:  Procedure Laterality Date  . CARDIAC  CATHETERIZATION N/A 09/12/2015   Procedure: Left Heart Cath and Coronary Angiography;  Surgeon: Nelva Bush, MD;  Location: [facility];  Service: Cardiovascular;  Laterality: N/A;  . CESAREAN SECTION  1987& 1989  . CHOLECYSTECTOMY, LAPAROSCOPIC  1988  . hystrodectomy  2009  . tonselectomy    . ulnar nerve dispo      Current Medications: Outpatient Medications Prior to Visit  Medication Sig Dispense Refill  . aspirin EC 81 MG tablet Take 1 tablet (81 mg total) by mouth daily. 90 tablet 3  . atorvastatin (LIPITOR) 40 MG tablet Take 1 tablet (40 mg total) by mouth daily. 30 tablet 5  . cetirizine-pseudoephedrine (ZYRTEC-D) 5-120 MG tablet Take 1 tablet by mouth daily. Reported on 06/26/2015    . Cholecalciferol (VITAMIN D3) 1000 units CAPS Take 1,000 Units by mouth daily.    . diazepam (VALIUM) 5 MG tablet Take 5 mg by mouth daily as needed.     . furosemide (LASIX) 20 MG tablet Take 20 mg by mouth daily.    Marland Kitchen losartan (COZAAR) 50 MG tablet Take 50 mg by mouth daily after breakfast.     . metFORMIN (GLUCOPHAGE) 500 MG tablet Take by mouth daily with breakfast.    . mirabegron ER (MYRBETRIQ) 25 MG TB24 tablet Take 25 mg by mouth every  morning.    . pantoprazole (PROTONIX) 40 MG tablet Take 40 mg by mouth every morning.     . potassium chloride (KLOR-CON) 8 MEQ tablet Take 8 mEq by mouth daily.     No facility-administered medications prior to visit.      Allergies:   Duricef [cefadroxil]   Social History   Social History  . Marital status: Married    Spouse name: N/A  . Number of children: N/A  . Years of education: N/A   Social History Main Topics  . Smoking status: Never Smoker  . Smokeless tobacco: Never Used  . Alcohol use No  . Drug use: No  . Sexual activity: Yes    Partners: Female   Other Topics Concern  . None   Social History Narrative  . None     Family History:  The patient's father had kidney, bladder cancer and leukemia.     ROS:   Please see  the history of present illness.    ROS All other systems reviewed and are negative.   PHYSICAL EXAM:   VS:  BP 116/68   Pulse 85   Ht 5\' 4"  (1.626 m)   Wt 109.3 kg (241 lb)   SpO2 98%   BMI 41.37 kg/m    GEN: Well nourished, well developed, in no acute distress, obese HEENT: normal  Neck: no JVD, carotid bruits, or masses Cardiac: RRR; no murmurs, rubs, or gallops,no edema  Respiratory:  clear to auscultation bilaterally, normal work of breathing GI: soft, nontender, nondistended, + BS MS: no deformity or atrophy  Skin: warm and dry, no rash Neuro:  Alert and Oriented x 3, Strength and sensation are intact Psych: euthymic mood, full affect  Wt Readings from Last 3 Encounters:  11/18/15 109.3 kg (241 lb)  10/10/15 112.5 kg (248 lb)  09/12/15 112.9 kg (249 lb)      Studies/Labs Reviewed:   EKG:  EKG is NOT ordered today.    Recent Labs: 09/08/2015: BUN 15; Creat 0.78; Hemoglobin 12.6; Platelets 434; Potassium 4.1; Sodium 140   Lipid Panel No results found for: CHOL, TRIG, HDL, CHOLHDL, VLDL, LDLCALC, LDLDIRECT  Additional studies/ records that were reviewed today include:  2D ECHO: 08/26/2015 LV EF: 60% -   65% Study Conclusions - Left ventricle: The cavity size was normal. Wall thickness was   increased in a pattern of mild LVH. Systolic function was normal.   The estimated ejection fraction was in the range of 60% to 65%.   Wall motion was normal; there were no regional wall motion   abnormalities. Left ventricular diastolic function parameters   were normal. - Aortic valve: Mildly calcified annulus. Trileaflet; normal   thickness leaflets. Valve area (VTI): 2.06 cm^2. Valve area   (Vmax): 1.83 cm^2. Valve area (Vmean): 1.82 cm^2. - Mitral valve: Mildly calcified annulus. Normal thickness leaflets - Atrial septum: No defect or patent foramen ovale was identified. - Technically difficult study. Echocontrast was used to enhance   visualization.  LHC  09/12/15 Conclusion  1. 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  The angiographic appearance of the disease does not correlate with the stress test findings. 2. Normal to hyperdynamic left ventricular contraction with upper normal filling pressure.  Recommendations: 1. Medical therapy and evaluation for other potential causes of the patient's chest pain, palpitations, and shortness of breath. 2. Will initiate atorvastatin 40 mg daily to prevent progression of CAD.  LDL in 04/2015 was 118. 3. Follow-up with  Dr. Johnsie Cancel as an outpatient.     ASSESSMENT & PLAN:   Dyspnea: likely related to obesity and deconditioning. I recommended diet and exercise  Morbid Obesity: Body mass index is 41.37 kg/m.  HTN: BP well controlled  CAD: non obstructive CAD. Continue statin. Will start ASA 81mg  daily    HLD: LDL 118. Recently started on a atorva 40mg  daily  DM: Discussed low carb diet.  Target hemoglobin A1c is 6.5 or less.  Continue current medications.   Proteinuria and kidney mass: being followed by urology now. Nephrotic syndrome likely the cause of LE edema. Bosniak 65F requires f/u US/CT In 6 months as reasonable     Jenkins Rouge

## 2015-11-18 ENCOUNTER — Ambulatory Visit (INDEPENDENT_AMBULATORY_CARE_PROVIDER_SITE_OTHER): Payer: Managed Care, Other (non HMO) | Admitting: Cardiovascular Disease

## 2015-11-18 ENCOUNTER — Encounter: Payer: Self-pay | Admitting: Cardiovascular Disease

## 2015-11-18 VITALS — BP 116/68 | HR 85 | Ht 64.0 in | Wt 241.0 lb

## 2015-11-18 DIAGNOSIS — R072 Precordial pain: Secondary | ICD-10-CM | POA: Diagnosis not present

## 2015-11-18 NOTE — Patient Instructions (Signed)
Your physician recommends that you schedule a follow-up appointment in: As Needed  Your physician recommends that you continue on your current medications as directed. Please refer to the Current Medication list given to you today.  Thank you for choosing [facility]!    

## 2016-01-14 ENCOUNTER — Other Ambulatory Visit: Payer: Self-pay | Admitting: Urology

## 2016-01-14 DIAGNOSIS — R3121 Asymptomatic microscopic hematuria: Secondary | ICD-10-CM

## 2016-01-20 ENCOUNTER — Ambulatory Visit (HOSPITAL_COMMUNITY)
Admission: RE | Admit: 2016-01-20 | Discharge: 2016-01-20 | Disposition: A | Discharge status: Home / Self Care | Payer: Managed Care, Other (non HMO) | Source: Ambulatory Visit | Attending: Urology | Admitting: Urology

## 2016-01-20 DIAGNOSIS — R3121 Asymptomatic microscopic hematuria: Secondary | ICD-10-CM | POA: Diagnosis not present

## 2016-01-20 DIAGNOSIS — Z9049 Acquired absence of other specified parts of digestive tract: Secondary | ICD-10-CM | POA: Diagnosis not present

## 2016-01-20 DIAGNOSIS — K5730 Diverticulosis of large intestine without perforation or abscess without bleeding: Secondary | ICD-10-CM | POA: Insufficient documentation

## 2016-01-20 DIAGNOSIS — K760 Fatty (change of) liver, not elsewhere classified: Secondary | ICD-10-CM | POA: Diagnosis not present

## 2016-01-20 LAB — POCT I-STAT CREATININE: Creatinine, Ser: 0.8 mg/dL (ref 0.44–1.00)

## 2016-01-20 MED ORDER — GADOBENATE DIMEGLUMINE 529 MG/ML IV SOLN
20.0000 mL | Freq: Once | INTRAVENOUS | Status: AC | PRN
  Administered 2016-01-20: 20 mL via INTRAVENOUS

## 2016-07-21 ENCOUNTER — Other Ambulatory Visit: Payer: Self-pay | Admitting: Urology

## 2016-07-21 DIAGNOSIS — R3121 Asymptomatic microscopic hematuria: Secondary | ICD-10-CM

## 2016-07-29 ENCOUNTER — Ambulatory Visit (HOSPITAL_COMMUNITY)
Admission: RE | Admit: 2016-07-29 | Discharge: 2016-07-29 | Disposition: A | Discharge status: Home / Self Care | Payer: Managed Care, Other (non HMO) | Source: Ambulatory Visit | Attending: Urology | Admitting: Urology

## 2016-07-29 DIAGNOSIS — N2889 Other specified disorders of kidney and ureter: Secondary | ICD-10-CM | POA: Diagnosis not present

## 2016-07-29 DIAGNOSIS — K760 Fatty (change of) liver, not elsewhere classified: Secondary | ICD-10-CM | POA: Insufficient documentation

## 2016-07-29 DIAGNOSIS — R3121 Asymptomatic microscopic hematuria: Secondary | ICD-10-CM

## 2016-07-29 DIAGNOSIS — R3129 Other microscopic hematuria: Secondary | ICD-10-CM | POA: Diagnosis present

## 2016-07-29 LAB — POCT I-STAT CREATININE: CREATININE: 0.8 mg/dL (ref 0.44–1.00)

## 2016-07-29 MED ORDER — GADOBENATE DIMEGLUMINE 529 MG/ML IV SOLN
20.0000 mL | Freq: Once | INTRAVENOUS | Status: AC | PRN
  Administered 2016-07-29: 20 mL via INTRAVENOUS

## 2016-08-13 ENCOUNTER — Ambulatory Visit (INDEPENDENT_AMBULATORY_CARE_PROVIDER_SITE_OTHER): Payer: Managed Care, Other (non HMO) | Admitting: Urology

## 2016-08-13 DIAGNOSIS — Q618 Other cystic kidney diseases: Secondary | ICD-10-CM

## 2016-08-13 DIAGNOSIS — N3946 Mixed incontinence: Secondary | ICD-10-CM | POA: Diagnosis not present

## 2016-08-13 DIAGNOSIS — R350 Frequency of micturition: Secondary | ICD-10-CM

## 2016-10-15 NOTE — Progress Notes (Signed)
Cardiology Office Note    Date:  10/18/2016   ID:  Shannon Hawkins, DOB [birth year], MRN [id number]  PCP:  Caryl Bis, MD  Cardiologist: Dr. Johnsie Cancel   CC:  Chest Pain   History of Present Illness:  Shannon Hawkins is a 51 y.o. female with a history of HTN, DMT2, obesity Seen by PA June 2017 for edema, dyspnea and  Chest pain   Cath done by Dr End 09/12/15 with no obstructive disease normal EF normal EDP Echo 08/26/15 EF 33-00% normal diastolic parameters   Has a 1.7 cm cystic lesion in midpole of left kidney.   She has dependant edema and benign palpitations Working 8-12 hrs/day at [employer] has Bipolar and is disabled stays in [facility]    Past Medical History:  Diagnosis Date  . Cancer ([place])    FAMILY HISTORY OF BLADDER CANCER  . DM type 2 (diabetes mellitus, type 2) ([place])   . Edema   . GERD (gastroesophageal reflux disease)   . Gross hematuria   . HTN (hypertension)   . Urinary frequency     Past Surgical History:  Procedure Laterality Date  . CARDIAC CATHETERIZATION N/A 09/12/2015   Procedure: Left Heart Cath and Coronary Angiography;  Surgeon: Nelva Bush, MD;  Location: [facility];  Service: Cardiovascular;  Laterality: N/A;  . CESAREAN SECTION  1987& 1989  . CHOLECYSTECTOMY, LAPAROSCOPIC  1988  . hystrodectomy  2009  . tonselectomy    . ulnar nerve dispo      Current Medications: Outpatient Medications Prior to Visit  Medication Sig Dispense Refill  . aspirin EC 81 MG tablet Take 1 tablet (81 mg total) by mouth daily. 90 tablet 3  . cetirizine-pseudoephedrine (ZYRTEC-D) 5-120 MG tablet Take 1 tablet by mouth daily. Reported on 06/26/2015    . Cholecalciferol (VITAMIN D3) 1000 units CAPS Take 1,000 Units by mouth daily.    . diazepam (VALIUM) 5 MG tablet Take 5 mg by mouth daily as needed.     . furosemide (LASIX) 20 MG tablet Take 20 mg by mouth daily.    Marland Kitchen losartan (COZAAR) 50 MG tablet Take 50 mg by mouth daily after breakfast.      . metFORMIN (GLUCOPHAGE) 500 MG tablet Take by mouth daily with breakfast.    . mirabegron ER (MYRBETRIQ) 25 MG TB24 tablet Take 25 mg by mouth every morning.    . pantoprazole (PROTONIX) 40 MG tablet Take 40 mg by mouth every morning.     . potassium chloride (KLOR-CON) 8 MEQ tablet Take 8 mEq by mouth daily.    Marland Kitchen atorvastatin (LIPITOR) 40 MG tablet Take 1 tablet (40 mg total) by mouth daily. 30 tablet 5   No facility-administered medications prior to visit.      Allergies:   Duricef [cefadroxil]   Social History   Social History  . Marital status: Married    Spouse name: N/A  . Number of children: N/A  . Years of education: N/A   Social History Main Topics  . Smoking status: Never Smoker  . Smokeless tobacco: Never Used  . Alcohol use No  . Drug use: No  . Sexual activity: Yes    Partners: Female   Other Topics Concern  . None   Social History Narrative  . None     Family History:  The patient's father had kidney, bladder cancer and leukemia.     ROS:   Please see the history of present illness.  ROS All other systems reviewed and are negative.   PHYSICAL EXAM:   VS:  BP (!) 142/84 (BP Location: Right Arm)   Pulse 91   Ht 5\' 4"  (1.626 m)   Wt 253 lb (114.8 kg)   SpO2 98%   BMI 43.43 kg/m    Affect appropriate Obese white female  HEENT: normal Neck supple with no adenopathy JVP normal no bruits no thyromegaly Lungs clear with no wheezing and good diaphragmatic motion Heart:  S1/S2 no murmur, no rub, gallop or click PMI normal Abdomen: benighn, BS positve, no tenderness, no AAA no bruit.  No HSM or HJR Distal pulses intact with no bruits No edema Neuro non-focal Skin warm and dry No muscular weakness   Wt Readings from Last 3 Encounters:  10/18/16 253 lb (114.8 kg)  11/18/15 241 lb (109.3 kg)  10/10/15 248 lb (112.5 kg)      Studies/Labs Reviewed:   EKG:  EKG is NOT ordered today.    Recent Labs: 07/29/2016: Creatinine, Ser 0.80    Lipid Panel No results found for: CHOL, TRIG, HDL, CHOLHDL, VLDL, LDLCALC, LDLDIRECT  Additional studies/ records that were reviewed today include:  2D ECHO: 08/26/2015 LV EF: 60% -   65% Study Conclusions - Left ventricle: The cavity size was normal. Wall thickness was   increased in a pattern of mild LVH. Systolic function was normal.   The estimated ejection fraction was in the range of 60% to 65%.   Wall motion was normal; there were no regional wall motion   abnormalities. Left ventricular diastolic function parameters   were normal. - Aortic valve: Mildly calcified annulus. Trileaflet; normal   thickness leaflets. Valve area (VTI): 2.06 cm^2. Valve area   (Vmax): 1.83 cm^2. Valve area (Vmean): 1.82 cm^2. - Mitral valve: Mildly calcified annulus. Normal thickness leaflets - Atrial septum: No defect or patent foramen ovale was identified. - Technically difficult study. Echocontrast was used to enhance   visualization.  LHC 09/12/15 Conclusion  1. 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  The angiographic appearance of the disease does not correlate with the stress test findings. 2. Normal to hyperdynamic left ventricular contraction with upper normal filling pressure.  Recommendations: 1. Medical therapy and evaluation for other potential causes of the patient's chest pain, palpitations, and shortness of breath. 2. Will initiate atorvastatin 40 mg daily to prevent progression of CAD.  LDL in 04/2015 was 118. 3. Follow-up with Dr. Johnsie Cancel as an outpatient.     ASSESSMENT & PLAN:   Dyspnea: likely related to obesity and deconditioning. I recommended diet and exercise Echo and cath normal   Morbid Obesity: discussed diet and exercise ok to consider bariatric surgery   HTN: Well controlled.  Continue current medications and low sodium Dash type diet.    CAD: non obstructive CAD by cath 09/12/15 . Continue statin. Will start ASA 81mg  daily     HLD: LDL 118. On statin labs with primary   DM: Discussed low carb diet.  Target hemoglobin A1c is 6.5 or less.  Continue current medications.  Renal:  F/u MRI next year appears to be benign     Jenkins Rouge

## 2016-10-18 ENCOUNTER — Ambulatory Visit (INDEPENDENT_AMBULATORY_CARE_PROVIDER_SITE_OTHER): Payer: Managed Care, Other (non HMO) | Admitting: Cardiovascular Disease

## 2016-10-18 ENCOUNTER — Encounter: Payer: Self-pay | Admitting: Cardiovascular Disease

## 2016-10-18 VITALS — BP 142/84 | HR 91 | Ht 64.0 in | Wt 253.0 lb

## 2016-10-18 DIAGNOSIS — I10 Essential (primary) hypertension: Secondary | ICD-10-CM

## 2016-10-18 NOTE — Patient Instructions (Signed)
Medication Instructions:  Your physician recommends that you continue on your current medications as directed. Please refer to the Current Medication list given to you today.   Labwork: NONE  Testing/Procedures: NONE  Follow-Up: Your physician recommends that you schedule a follow-up appointment in: AS NEEDED      Any Other Special Instructions Will Be Listed Below (If Applicable).     If you need a refill on your cardiac medications before your next appointment, please call your pharmacy.   

## 2017-01-18 NOTE — Progress Notes (Signed)
Cardiology Office Note    Date:  01/19/2017   ID:  Shannon Hawkins, DOB [birth year], MRN [id number]  PCP:  System, Provider Not In  Cardiologist: Dr. Johnsie Cancel  Chief Complaint  Patient presents with  . Edema    History of Present Illness:  Shannon Hawkins is a 52 y.o. female with history of hypertension, diabetes mellitus, obesity, chest pain with cardiac cath 09/12/15 no obstructive disease normal LVEF and normal EDP.  Echo 08/2015 LVEF 76-28% normal diastolic parameters.  Last saw Dr. Johnsie Cancel 10/2016 and dyspnea felt secondary to obesity and deconditioning.  Patient was hospitalized at [facility] 12/30/18 for chest pain and shortness of breath.  CT scan showed no evidence of pulmonary embolus pneumonia or acute cardiopulmonary process.  It did find a 6 mm right middle lobe pulmonary nodule recommend follow-up CT in 6-12 months.  BNP was normal at 147, troponin negative d-dimer normal WBC was elevated at 13, EKG without acute change.  Will up CBC by primary care was normal and she was referred to pulmonary for possible sleep apnea.Was given Iv lasix x1.  Patient complains of edema not improved with lasix. Even when increased to 40 mg daily for a week and no difference in edema or weight. Short of breath with activity and at rest. Pain in back comes around chest and tightness when takes a deep breath. Also on a decongestant and heart has been racing. Continues to gain weight. Eats out a lot and frozen dinners. Moved to [place] for her job but goes back to [place], [place] every weekend for her family.  Past Medical History:  Diagnosis Date  . Cancer ([place])    FAMILY HISTORY OF BLADDER CANCER  . DM type 2 (diabetes mellitus, type 2) ([place])   . Edema   . GERD (gastroesophageal reflux disease)   . Gross hematuria   . HTN (hypertension)   . Urinary frequency     Past Surgical History:  Procedure Laterality Date  . CARDIAC CATHETERIZATION N/A 09/12/2015   Procedure: Left Heart Cath and Coronary  Angiography;  Surgeon: Nelva Bush, MD;  Location: [facility];  Service: Cardiovascular;  Laterality: N/A;  . CESAREAN SECTION  1987& 1989  . CHOLECYSTECTOMY, LAPAROSCOPIC  1988  . hystrodectomy  2009  . tonselectomy    . ulnar nerve dispo      Current Medications: Current Meds  Medication Sig  . aspirin EC 81 MG tablet Take 1 tablet (81 mg total) by mouth daily.  . cetirizine-pseudoephedrine (ZYRTEC-D) 5-120 MG tablet Take 1 tablet by mouth daily. Reported on 06/26/2015  . Cholecalciferol (VITAMIN D3) 1000 units CAPS Take 1,000 Units by mouth daily.  . diazepam (VALIUM) 5 MG tablet Take 5 mg by mouth daily as needed.   . furosemide (LASIX) 20 MG tablet Take 20 mg by mouth daily.  Marland Kitchen losartan (COZAAR) 50 MG tablet Take 50 mg by mouth daily after breakfast.   . metFORMIN (GLUCOPHAGE) 500 MG tablet Take by mouth daily with breakfast.  . mirabegron ER (MYRBETRIQ) 25 MG TB24 tablet Take 25 mg by mouth every morning.  . pantoprazole (PROTONIX) 40 MG tablet Take 40 mg by mouth every morning.   . potassium chloride (KLOR-CON) 8 MEQ tablet Take 8 mEq by mouth daily.     Allergies:   Cefadroxil   Social History   Socioeconomic History  . Marital status: Married    Spouse name: None  . Number of children: None  . Years of education: None  .  Highest education level: None  Social Needs  . Financial resource strain: None  . Food insecurity - worry: None  . Food insecurity - inability: None  . Transportation needs - medical: None  . Transportation needs - non-medical: None  Occupational History  . None  Tobacco Use  . Smoking status: Never Smoker  . Smokeless tobacco: Never Used  Substance and Sexual Activity  . Alcohol use: No    Alcohol/week: 0.0 oz  . Drug use: No  . Sexual activity: Yes    Partners: Female  Other Topics Concern  . None  Social History Narrative  . None     Family History:  The patient's family history includes Atrial fibrillation in her mother;  Bladder Cancer in her unknown relative; CVA in her brother; Cancer in her father; Heart attack (age of onset: 22) in her brother; Hypertension in her mother.   ROS:   Please see the history of present illness.    Review of Systems  Constitution: Positive for weight gain.  HENT: Negative.   Eyes: Negative.   Cardiovascular: Positive for chest pain, dyspnea on exertion and leg swelling.  Respiratory: Positive for shortness of breath, sleep disturbances due to breathing and snoring.   Hematologic/Lymphatic: Negative.   Musculoskeletal: Negative.  Negative for joint pain.  Gastrointestinal: Negative.   Genitourinary: Negative.   Neurological: Negative.    All other systems reviewed and are negative.   PHYSICAL EXAM:   VS:  BP 126/88   Pulse 94   Ht 5\' 4"  (1.626 m)   Wt 262 lb (118.8 kg)   SpO2 95%   BMI 44.97 kg/m   Physical Exam  GEN: Obsese, in no acute distress  Neck: no JVD, carotid bruits, or masses Cardiac:RRR; no murmurs, rubs, or gallops  Respiratory:  clear to auscultation bilaterally, normal work of breathing GI: soft, nontender, nondistended, + BS Ext: without cyanosis, clubbing, or edema, Good distal pulses bilaterally Neuro:  Alert and Oriented x 3 Psych: euthymic mood, full affect  Wt Readings from Last 3 Encounters:  01/19/17 262 lb (118.8 kg)  10/18/16 253 lb (114.8 kg)  11/18/15 241 lb (109.3 kg)      Studies/Labs Reviewed:   EKG:  EKG is not ordered today.  EKG reviewed from [facility] and without acute change  Recent Labs: 07/29/2016: Creatinine, Ser 0.80   Lipid Panel No results found for: CHOL, TRIG, HDL, CHOLHDL, VLDL, LDLCALC, LDLDIRECT  Additional studies/ records that were reviewed today include:  2D ECHO: 08/26/2015 LV EF: 60% -   65% Study Conclusions - Left ventricle: The cavity size was normal. Wall thickness was   increased in a pattern of mild LVH. Systolic function was normal.   The estimated ejection fraction was in the  range of 60% to 65%.   Wall motion was normal; there were no regional wall motion   abnormalities. Left ventricular diastolic function parameters   were normal. - Aortic valve: Mildly calcified annulus. Trileaflet; normal   thickness leaflets. Valve area (VTI): 2.06 cm^2. Valve area   (Vmax): 1.83 cm^2. Valve area (Vmean): 1.82 cm^2. - Mitral valve: Mildly calcified annulus. Normal thickness leaflets - Atrial septum: No defect or patent foramen ovale was identified. - Technically difficult study. Echocontrast was used to enhance   visualization.   LHC 09/12/15 Conclusion  1. 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  The angiographic appearance of the disease does not correlate with the stress test findings. 2. Normal to  hyperdynamic left ventricular contraction with upper normal filling pressure.   Recommendations: 1. Medical therapy and evaluation for other potential causes of the patient's chest pain, palpitations, and shortness of breath. 2. Will initiate atorvastatin 40 mg daily to prevent progression of CAD.  LDL in 04/2015 was 118. 3. Follow-up with Dr. Johnsie Cancel as an outpatient.          ASSESSMENT:    1. Generalized edema   2. SOB (shortness of breath)   3. Essential hypertension   4. Precordial pain      PLAN:  In order of problems listed above:  Edema and shortness of breath suspect secondary to obesity, deconditioning, high salt diet.  Had normal LV function on cardiac cath and 2D echo in 2017.  I think it does warrant repeating the 2D echo.  Discussed lifestyle changes including weight loss program such as weight watchers and 2 g sodium diet.F/U with me in 3 weeks. Also seeing pulmonary for possible sleep apnea.  Essential hypertension blood pressure was up when she went to the hospital but is normal today.  I suspect this is related to her lifestyle as well.  Chest pain atypical CT scan without evidence of PE & cardiac cath in 2017  nonobstructive disease.  Diabetes mellitus type 2 managed by primary care.  Medication Adjustments/Labs and Tests Ordered: Current medicines are reviewed at length with the patient today.  Concerns regarding medicines are outlined above.  Medication changes, Labs and Tests ordered today are listed in the Patient Instructions below. Patient Instructions  Medication Instructions:  Your physician recommends that you continue on your current medications as directed. Please refer to the Current Medication list given to you today.   Labwork: NONE   Testing/Procedures: Your physician has requested that you have an echocardiogram. Echocardiography is a painless test that uses sound waves to create images of your heart. It provides your doctor with information about the size and shape of your heart and how well your heart's chambers and valves are working. This procedure takes approximately one hour. There are no restrictions for this procedure.    Follow-Up: Your physician recommends that you schedule a follow-up appointment in: [place], PA-C    Any Other Special Instructions Will Be Listed Below (If Applicable).  You have been given a copy of a low sodium diet.    If you need a refill on your cardiac medications before your next appointment, please call your pharmacy.  Thank you for choosing [facility]!      Signed, Ermalinda Barrios, PA-C  01/19/2017 1:35 PM    [facility] [address], [place], [place]  [address] Phone: [phone]; Fax: [phone]

## 2017-01-19 ENCOUNTER — Ambulatory Visit (INDEPENDENT_AMBULATORY_CARE_PROVIDER_SITE_OTHER): Payer: Managed Care, Other (non HMO) | Admitting: Physician Assistant

## 2017-01-19 ENCOUNTER — Encounter: Payer: Self-pay | Admitting: Physician Assistant

## 2017-01-19 VITALS — BP 126/88 | HR 94 | Ht 64.0 in | Wt 262.0 lb

## 2017-01-19 DIAGNOSIS — R0602 Shortness of breath: Secondary | ICD-10-CM | POA: Diagnosis not present

## 2017-01-19 DIAGNOSIS — R072 Precordial pain: Secondary | ICD-10-CM

## 2017-01-19 DIAGNOSIS — E119 Type 2 diabetes mellitus without complications: Secondary | ICD-10-CM

## 2017-01-19 DIAGNOSIS — R601 Generalized edema: Secondary | ICD-10-CM

## 2017-01-19 DIAGNOSIS — I10 Essential (primary) hypertension: Secondary | ICD-10-CM | POA: Diagnosis not present

## 2017-01-19 NOTE — Patient Instructions (Signed)
Medication Instructions:  Your physician recommends that you continue on your current medications as directed. Please refer to the Current Medication list given to you today.   Labwork: NONE   Testing/Procedures: Your physician has requested that you have an echocardiogram. Echocardiography is a painless test that uses sound waves to create images of your heart. It provides your doctor with information about the size and shape of your heart and how well your heart's chambers and valves are working. This procedure takes approximately one hour. There are no restrictions for this procedure.    Follow-Up: Your physician recommends that you schedule a follow-up appointment in: [place], PA-C    Any Other Special Instructions Will Be Listed Below (If Applicable).  You have been given a copy of a low sodium diet.    If you need a refill on your cardiac medications before your next appointment, please call your pharmacy.  Thank you for choosing [facility]!

## 2017-01-21 ENCOUNTER — Ambulatory Visit (HOSPITAL_COMMUNITY)
Admission: RE | Admit: 2017-01-21 | Discharge: 2017-01-21 | Disposition: A | Discharge status: Home / Self Care | Payer: Managed Care, Other (non HMO) | Source: Ambulatory Visit | Attending: Physician Assistant | Admitting: Physician Assistant

## 2017-01-21 DIAGNOSIS — I10 Essential (primary) hypertension: Secondary | ICD-10-CM | POA: Insufficient documentation

## 2017-01-21 DIAGNOSIS — I358 Other nonrheumatic aortic valve disorders: Secondary | ICD-10-CM | POA: Diagnosis not present

## 2017-01-21 DIAGNOSIS — K219 Gastro-esophageal reflux disease without esophagitis: Secondary | ICD-10-CM | POA: Insufficient documentation

## 2017-01-21 DIAGNOSIS — E119 Type 2 diabetes mellitus without complications: Secondary | ICD-10-CM | POA: Insufficient documentation

## 2017-01-21 DIAGNOSIS — R0602 Shortness of breath: Secondary | ICD-10-CM

## 2017-01-21 DIAGNOSIS — I209 Angina pectoris, unspecified: Secondary | ICD-10-CM | POA: Insufficient documentation

## 2017-01-21 LAB — ECHOCARDIOGRAM COMPLETE
E decel time: 225 msec
EERAT: 6.54
LA vol index: 21.6 mL/m2
LAVOL: 47.4 mL
LAVOLA4C: 37.3 mL
LV E/e' medial: 6.54
LV E/e'average: 6.54
LV TDI E'MEDIAL: 8.27
LVELAT: 10.9 cm/s
MV Dec: 225
MV Peak grad: 2 mmHg
MV pk A vel: 71.3 m/s
MVPKEVEL: 71.3 m/s
RV LATERAL S' VELOCITY: 9.9 cm/s
RV TAPSE: 18.7 mm
TDI e' lateral: 10.9

## 2017-01-21 NOTE — Progress Notes (Signed)
*  PRELIMINARY RESULTS* Echocardiogram 2D Echocardiogram has been performed.  Shannon Hawkins 01/21/2017, 4:09 PM

## 2017-02-09 ENCOUNTER — Ambulatory Visit: Payer: Managed Care, Other (non HMO) | Admitting: Physician Assistant

## 2017-02-09 ENCOUNTER — Encounter: Payer: Self-pay | Admitting: Physician Assistant

## 2017-02-09 VITALS — BP 138/88 | HR 85 | Ht 64.0 in | Wt 260.0 lb

## 2017-02-09 DIAGNOSIS — I10 Essential (primary) hypertension: Secondary | ICD-10-CM

## 2017-02-09 DIAGNOSIS — R911 Solitary pulmonary nodule: Secondary | ICD-10-CM | POA: Diagnosis not present

## 2017-02-09 DIAGNOSIS — R601 Generalized edema: Secondary | ICD-10-CM

## 2017-02-09 DIAGNOSIS — E119 Type 2 diabetes mellitus without complications: Secondary | ICD-10-CM | POA: Diagnosis not present

## 2017-02-09 NOTE — Progress Notes (Signed)
Cardiology Office Note    Date:  02/09/2017   ID:  Shannon Hawkins, DOB Sep 13, 1965, MRN [id number]  PCP:  System, Provider Not In  Cardiologist: Jenkins Rouge, MD  Chief Complaint  Patient presents with  . Follow-up    History of Present Illness:  Shannon Hawkins is a 52 y.o. female with history of hypertension, DM type II, obesity, chest pain with cardiac cath 09/2015 no obstructive disease normal LVEF and EDP.  2D echo 08/2015 normal LVEF 40-98% normal diastolic parameters.  Chronic dependent edema and benign palpitations.  Last saw Dr. Johnsie Cancel 10/18/16 complaining of dyspnea felt related to obesity and deconditioning.  Diet and exercise recommended.  1.7 cm cystic lesion in midpole left kidney on MR of the abdomen 07/29/16 needs one-year follow-up..  Patient was hospitalized at [facility] him 12/30/18 for chest pain and shortness of breath.  CT scan showed no evidence of pulmonary embolus pneumonia or acute cardiopulmonary process.  It did find a 6 mm right middle lobe pulmonary nodule recommend follow-up CT in 6-12 months.  BNP was normal at 147, troponin negative d-dimer normal WBC was elevated at 13, EKG without acute change.    Follow-up CBC by primary care was normal and she was referred to pulmonary for possible sleep apnea.Was given Iv lasix x1.   I saw patient 01/19/17 she was complaining of edema not improved with Lasix even when she increased it to 40 mg daily for a week.  Complained of pain in her back it comes around her chest and tightness when she takes a deep breath.  Is also a decongestant and heart was racing.  Is eating out a lot and eating frozen dinners. I discussed lifestyle changes including weight loss program with the patient.  2D echo 01/21/17 showed normal LV EF 55-60% with trivial MR and normal diastolic function.  Patient comes in today for f/u. She joined [employer] and lost 2 lbs. Joining [employer] today. Still has edema that is gone in am. No other complaints.  Past  Medical History:  Diagnosis Date  . Cancer ([place])    FAMILY HISTORY OF BLADDER CANCER  . DM type 2 (diabetes mellitus, type 2) ([place])   . Edema   . GERD (gastroesophageal reflux disease)   . Gross hematuria   . HTN (hypertension)   . Urinary frequency     Past Surgical History:  Procedure Laterality Date  . CARDIAC CATHETERIZATION N/A 09/12/2015   Procedure: Left Heart Cath and Coronary Angiography;  Surgeon: Nelva Bush, MD;  Location: [facility];  Service: Cardiovascular;  Laterality: N/A;  . CESAREAN SECTION  1987& 1989  . CHOLECYSTECTOMY, LAPAROSCOPIC  1988  . hystrodectomy  2009  . tonselectomy    . ulnar nerve dispo      Current Medications: Current Meds  Medication Sig  . aspirin EC 81 MG tablet Take 1 tablet (81 mg total) by mouth daily.  . cetirizine-pseudoephedrine (ZYRTEC-D) 5-120 MG tablet Take 1 tablet by mouth daily. Reported on 06/26/2015  . Cholecalciferol (VITAMIN D3) 1000 units CAPS Take 1,000 Units by mouth daily.  . diazepam (VALIUM) 5 MG tablet Take 5 mg by mouth daily as needed.   . furosemide (LASIX) 20 MG tablet Take 20 mg by mouth daily.  Marland Kitchen losartan (COZAAR) 50 MG tablet Take 50 mg by mouth daily after breakfast.   . metFORMIN (GLUCOPHAGE) 500 MG tablet Take by mouth daily with breakfast.  . mirabegron ER (MYRBETRIQ) 25 MG TB24 tablet Take 25  mg by mouth every morning.  . pantoprazole (PROTONIX) 40 MG tablet Take 40 mg by mouth every morning.   . potassium chloride (KLOR-CON) 8 MEQ tablet Take 8 mEq by mouth daily.     Allergies:   Cefadroxil   Social History   Socioeconomic History  . Marital status: Married    Spouse name: None  . Number of children: None  . Years of education: None  . Highest education level: None  Social Needs  . Financial resource strain: None  . Food insecurity - worry: None  . Food insecurity - inability: None  . Transportation needs - medical: None  . Transportation needs - non-medical: None  Occupational  History  . None  Tobacco Use  . Smoking status: Never Smoker  . Smokeless tobacco: Never Used  Substance and Sexual Activity  . Alcohol use: No    Alcohol/week: 0.0 oz  . Drug use: No  . Sexual activity: Yes    Partners: Female  Other Topics Concern  . None  Social History Narrative  . None     Family History:  The patient's family history includes Atrial fibrillation in her mother; Bladder Cancer in her unknown relative; CVA in her brother; Cancer in her father; Heart attack (age of onset: 30) in her brother; Hypertension in her mother.   ROS:   Please see the history of present illness.    Review of Systems  Constitution: Positive for malaise/fatigue.  HENT: Negative.   Cardiovascular: Positive for leg swelling.  Respiratory: Negative.   Endocrine: Negative.   Hematologic/Lymphatic: Negative.   Musculoskeletal: Negative.   Gastrointestinal: Negative.   Genitourinary: Negative.   Neurological: Positive for headaches.   All other systems reviewed and are negative.   PHYSICAL EXAM:   VS:  BP 138/88 (BP Location: Left Arm)   Pulse 85   Ht 5\' 4"  (1.626 m)   Wt 260 lb (117.9 kg)   SpO2 99%   BMI 44.63 kg/m   Physical Exam  GEN: Obese, in no acute distress  Neck: no JVD, carotid bruits, or masses Cardiac:RRR; 1/6 sys murmur apex Respiratory:  clear to auscultation bilaterally, normal work of breathing GI: soft, nontender, nondistended, + BS Ext: trace ankle edema otherwise without cyanosis, clubbing, or  Good distal pulses bilaterally Neuro:  Alert and Oriented x 3 Psych: euthymic mood, full affect  Wt Readings from Last 3 Encounters:  02/09/17 260 lb (117.9 kg)  01/19/17 262 lb (118.8 kg)  10/18/16 253 lb (114.8 kg)      Studies/Labs Reviewed:   EKG:  EKG is not ordered today.   Recent Labs: 07/29/2016: Creatinine, Ser 0.80   Lipid Panel No results found for: CHOL, TRIG, HDL, CHOLHDL, VLDL, LDLCALC, LDLDIRECT  Additional studies/ records that were  reviewed today include:  MR of the abdomen 07/29/16 IMPRESSION: Stable 1.7 cm indeterminate Bosniak category 27F cystic lesion in midpole of left kidney. Recommend continued followup by abdomen MRI without and with contrast in 12 months. This recommendation follows ACR consensus guidelines: Management of the Incidental Renal Mass on CT: A White Paper of the ACR Incidental Findings Committee. J Am Coll Radiol [phone].   Mild hepatic steatosis .     2D ECHO: 08/26/2015 LV EF: 60% -   65% Study Conclusions - Left ventricle: The cavity size was normal. Wall thickness was   increased in a pattern of mild LVH. Systolic function was normal.   The estimated ejection fraction was in the range of 60% to  65%.   Wall motion was normal; there were no regional wall motion   abnormalities. Left ventricular diastolic function parameters   were normal. - Aortic valve: Mildly calcified annulus. Trileaflet; normal   thickness leaflets. Valve area (VTI): 2.06 cm^2. Valve area   (Vmax): 1.83 cm^2. Valve area (Vmean): 1.82 cm^2. - Mitral valve: Mildly calcified annulus. Normal thickness leaflets - Atrial septum: No defect or patent foramen ovale was identified. - Technically difficult study. Echocontrast was used to enhance   visualization.   LHC 09/12/15 Conclusion  1. 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  The angiographic appearance of the disease does not correlate with the stress test findings. 2. Normal to hyperdynamic left ventricular contraction with upper normal filling pressure.   Recommendations: 1. Medical therapy and evaluation for other potential causes of the patient's chest pain, palpitations, and shortness of breath. 2. Will initiate atorvastatin 40 mg daily to prevent progression of CAD.  LDL in 04/2015 was 118. 3. Follow-up with Dr. Johnsie Cancel as an outpatient.      2D echo 01/21/17 study Conclusions   - Left ventricle: The cavity size was  normal. Wall thickness was   normal. Systolic function was normal. The estimated ejection   fraction was in the range of 55% to 60%. Wall motion was normal;   there were no regional wall motion abnormalities. Left   ventricular diastolic function parameters were normal. - Aortic valve: Mildly calcified annulus. Probably trileaflet. - Mitral valve: There was trivial regurgitation. - Right atrium: Central venous pressure (est): 3 mm Hg. - Atrial septum: No defect or patent foramen ovale was identified. - Tricuspid valve: There was trivial regurgitation. - Pulmonary arteries: Systolic pressure could not be accurately   estimated. - Pericardium, extracardiac: A prominent pericardial fat pad was   present.   Impressions:   - Normal LV wall thickness with LVEF 55-60% and normal diastolic   function. Trivial mitral regurgitation. Mildly calcified aortic   annulus. Trivial tricuspid regurgitation.       ASSESSMENT:    1. Generalized edema   2. Essential hypertension   3. Type 2 diabetes mellitus without complication, unspecified whether long term insulin use (HCC)   4. Pulmonary nodule, right      PLAN:  In order of problems listed above:  Generalized edema felt secondary to lifestyle and obesity 2D echo shows normal LV function and normal diastolic function with mild MR.  Continue weight loss program and 2 g sodium diet.  She has joined [employer] and is joining the [employer] today.  She has lost 2 pounds.  Follow-up with Dr. Johnsie Cancel in 1 year or as needed.  Being evaluated for sleep apnea by primary care  Essential hypertension blood pressure stable  Diabetes mellitus managed by primary care in [facility]  Chest pain 12/29/16 hospitalized in the [facility] negative for PE, pneumonia or acute cardiopulmonary process.  Does have a 6 mm right middle lobe pulmonary nodule needs follow-up on CT in 6-12 months. prior normal cath in 09/2015  Medication  Adjustments/Labs and Tests Ordered: Current medicines are reviewed at length with the patient today.  Concerns regarding medicines are outlined above.  Medication changes, Labs and Tests ordered today are listed in the Patient Instructions below. Patient Instructions  Medication Instructions:  Your physician recommends that you continue on your current medications as directed. Please refer to the Current Medication list given to you today.   Labwork: NONE   Testing/Procedures:  NONE   Follow-Up: Your physician wants you to follow-up in: 1 Year.  You will receive a reminder letter in the mail two months in advance. If you don't receive a letter, please call our office to schedule the follow-up appointment.   Any Other Special Instructions Will Be Listed Below (If Applicable).     If you need a refill on your cardiac medications before your next appointment, please call your pharmacy.  Thank you for choosing [facility]!      Signed, Ermalinda Barrios, PA-C  02/09/2017 2:25 PM    [facility] [address], [place], [place]  [address] Phone: [phone]; Fax: [phone]

## 2017-02-09 NOTE — Patient Instructions (Signed)
Medication Instructions:  Your physician recommends that you continue on your current medications as directed. Please refer to the Current Medication list given to you today.   Labwork: NONE   Testing/Procedures: NONE   Follow-Up: Your physician wants you to follow-up in: 1 Year. You will receive a reminder letter in the mail two months in advance. If you don't receive a letter, please call our office to schedule the follow-up appointment.   Any Other Special Instructions Will Be Listed Below (If Applicable).     If you need a refill on your cardiac medications before your next appointment, please call your pharmacy.  Thank you for choosing [facility]!   

## 2017-03-30 ENCOUNTER — Encounter: Payer: Self-pay | Admitting: Gastroenterology

## 2017-06-09 ENCOUNTER — Ambulatory Visit: Payer: Managed Care, Other (non HMO) | Admitting: Gastroenterology

## 2017-08-18 ENCOUNTER — Other Ambulatory Visit: Payer: Self-pay | Admitting: Urology

## 2017-08-18 DIAGNOSIS — N281 Cyst of kidney, acquired: Secondary | ICD-10-CM

## 2018-01-30 IMAGING — MR MR ABDOMEN WO/W CM
9 of 18 series · 21 of 48 positions shown · IV contrast (20ml Multihance)
Comparison: 01/20/2016 and 08/14/2015

CLINICAL DATA: Microscopic hematuria. Followup indeterminate left
renal cystic lesion.

EXAM:
MRI ABDOMEN WITHOUT AND WITH CONTRAST
TECHNIQUE: Multiplanar multisequence MR imaging of the abdomen was performed
both before and after the administration of intravenous contrast.
CONTRAST:  20mL MULTIHANCE GADOBENATE DIMEGLUMINE 529 MG/ML IV SOLN

[Series 4: T2 · coronal · 4.0mm · 1.19mm/px · 1 of 36 slices shown]
[im 1/36]
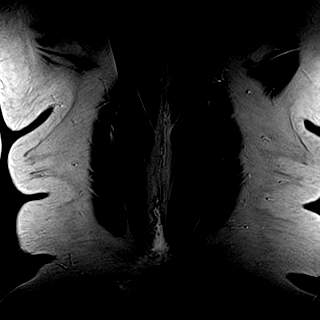

[Series 6: t2fs axial · axial · 5.0mm · 1.19mm/px · 1 of 40 slices shown]
[im 1/40]
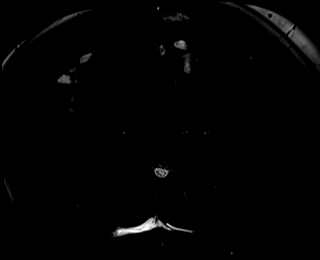

[Series 7: T1 · axial · 4.0mm · 0.69mm/px · z∈[-155,+97]mm · 2 of 64 slices shown (1 of 2)]
[im 1/64]
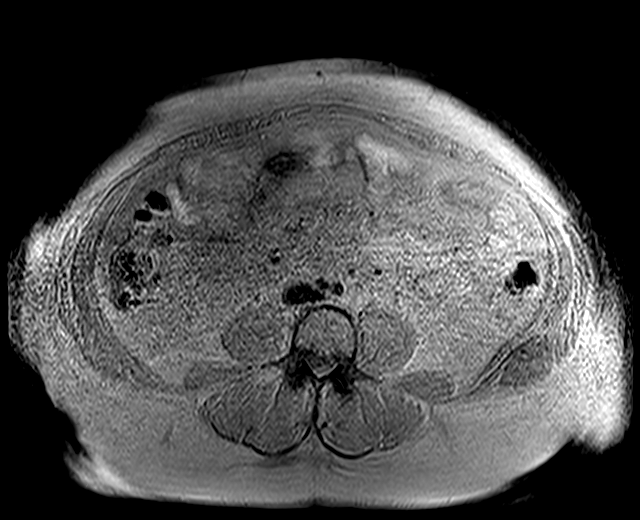
[im 64/64]
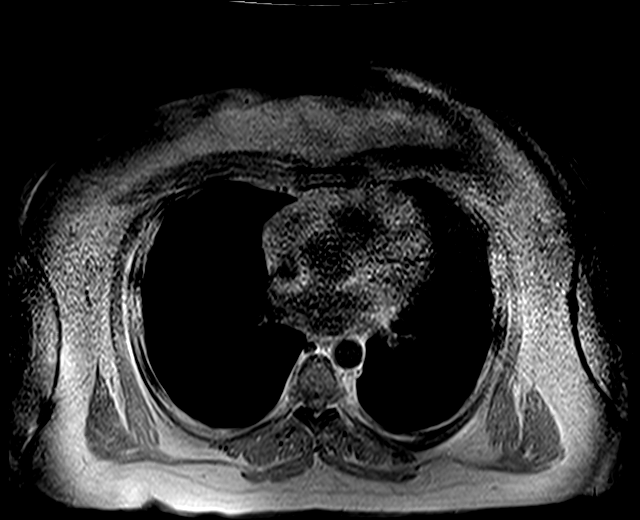

[Series 8: T1 · axial · 4.0mm · 0.69mm/px · z∈[-155,+97]mm · 3 of 64 slices shown (2 of 2)]
[im 1/64]
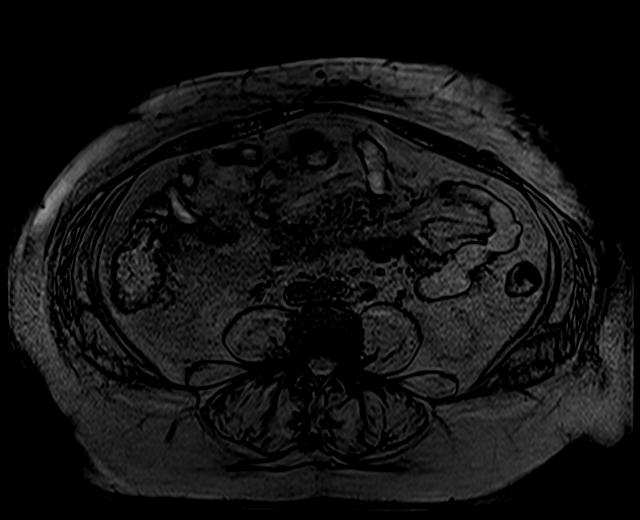
[im 32/64]
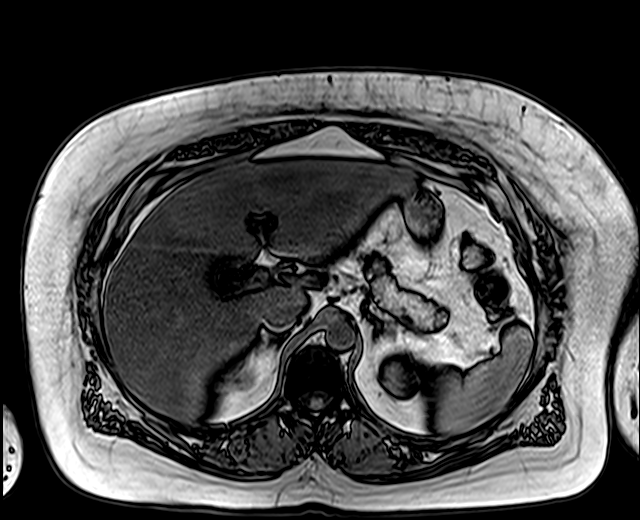
[im 64/64]
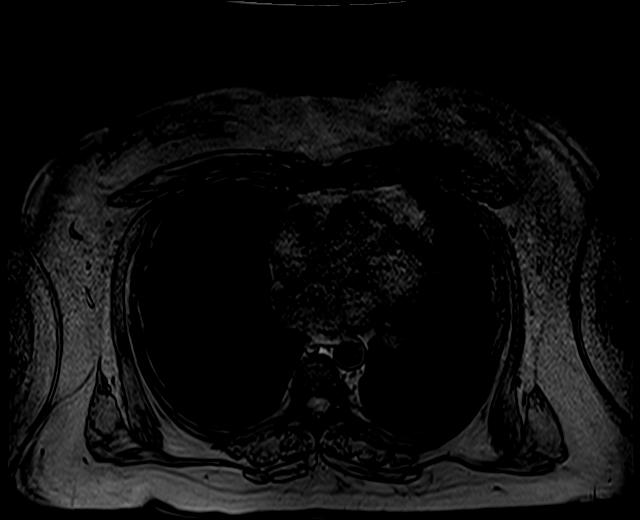

[Series 11: bSSFP · axial · 4.0mm · 0.86mm/px · z∈[-181,+95]mm · 3 of 70 slices shown]
[im 1/70]
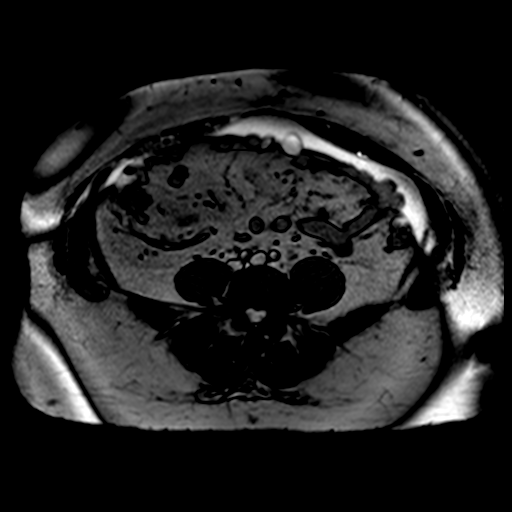
[im 35/70]
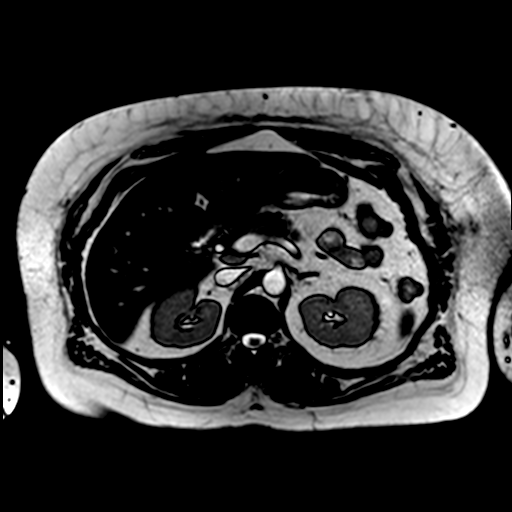
[im 70/70]
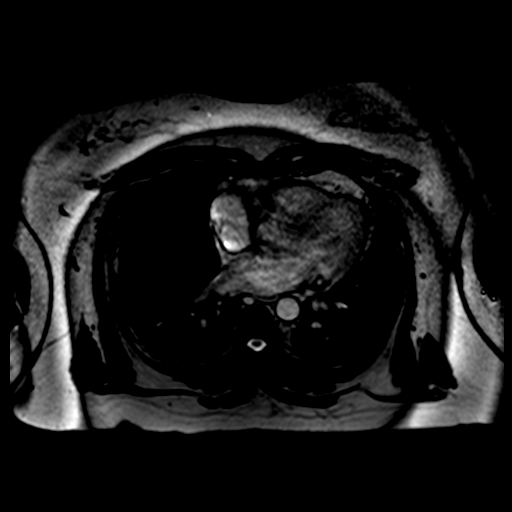

[Series 12: DWI · axial · 5.0mm · 1.15mm/px · z∈[-164,+70]mm · 3 of 80 slices shown (1 of 2)]
[im 1/80]
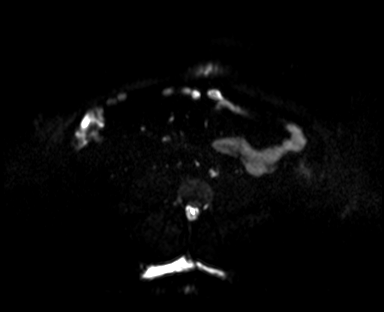
[im 40/80]
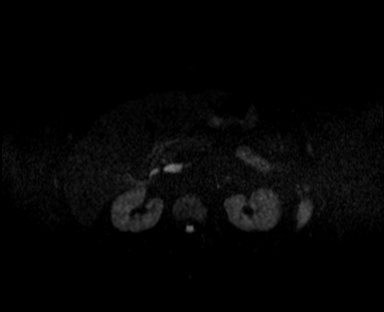
[im 80/80]
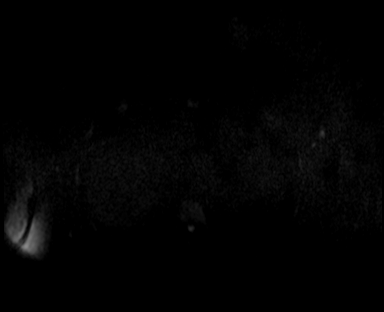

[Series 13: DWI · axial · 5.0mm · 1.15mm/px · z∈[-164,+70]mm · 2 of 40 slices shown (2 of 2)]
[im 1/40]
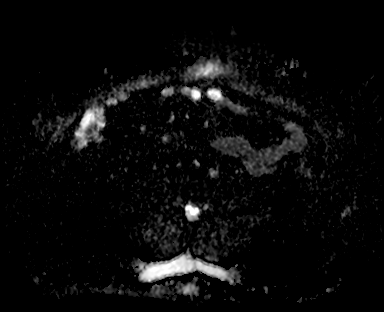
[im 40/40]
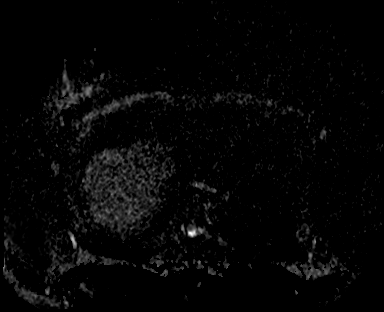

[Series 14: t1fs axial pre · axial · non-contrast · 3.0mm · 0.69mm/px · z∈[-155,+58]mm · 3 of 72 slices shown]
[im 1/72]
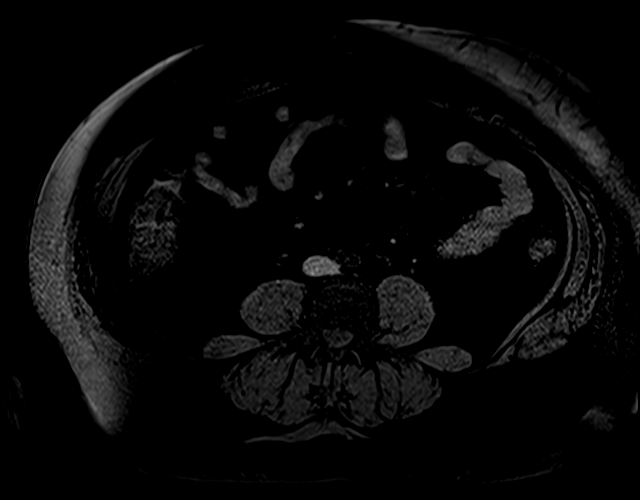
[im 36/72]
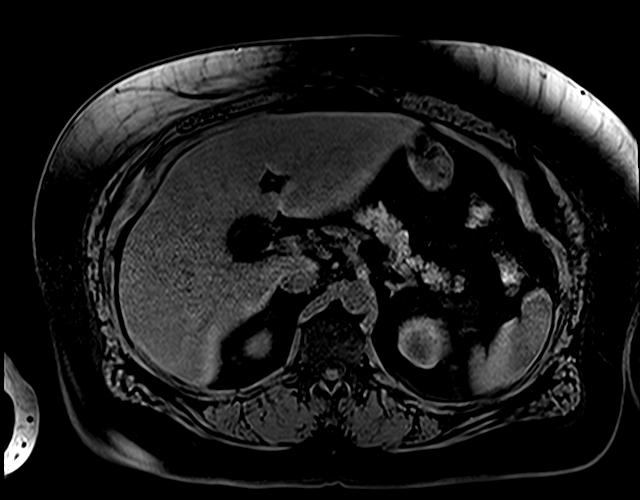
[im 72/72]
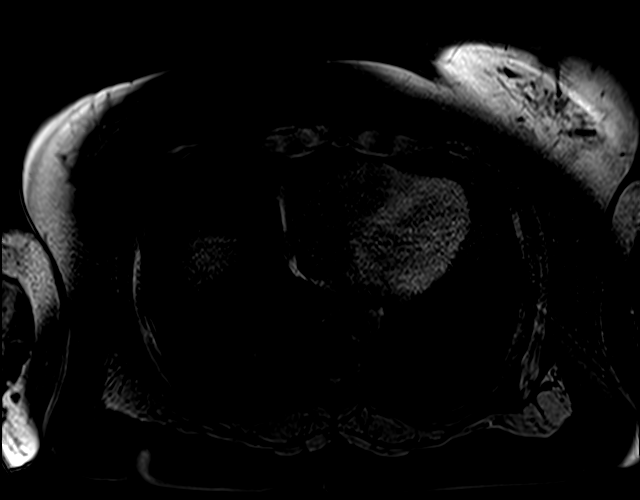

[Series 15: (id) delay · axial · delayed · 3.0mm · 0.69mm/px · z∈[-155,+58]mm · 3 of 72 slices shown]
[im 1/72]
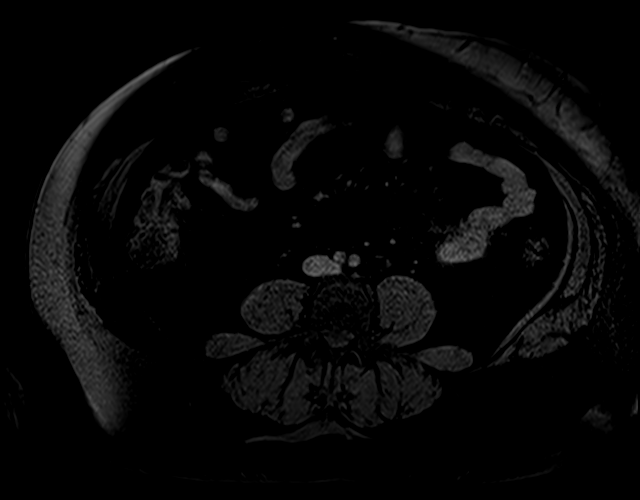
[im 36/72]
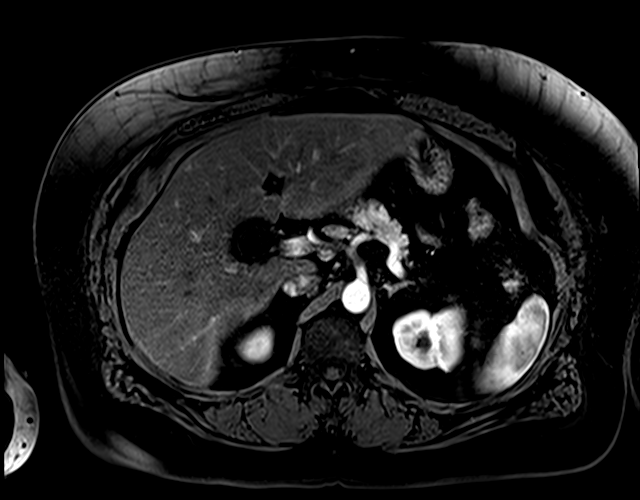
[im 72/72]
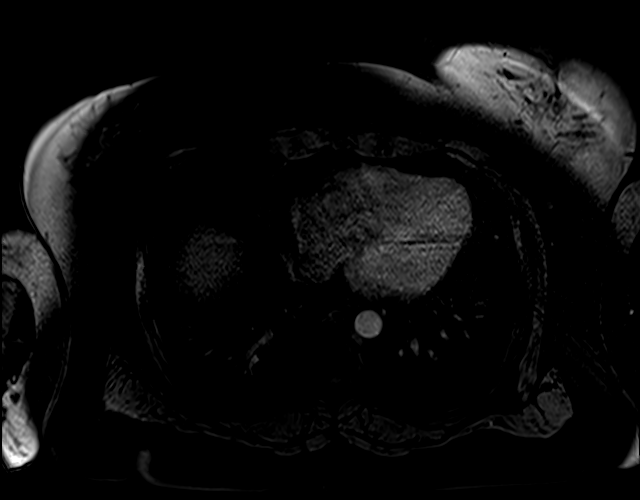

[21 of 48 positions shown; findings below may reference images not displayed]

FINDINGS: Lower chest: No acute findings.

Hepatobiliary: No masses identified. Mild hepatic steatosis seen on
chemical shift imaging. Prior cholecystectomy. No evidence of
biliary ductal dilatation.

Pancreas:  No mass or inflammatory changes.

Spleen:  Within normal limits in size and appearance.

Adrenals/Urinary Tract: A 1.7 cm cystic lesions again seen in the
posterior midpole the left kidney which shows T1 hyperintensity and
a few thin internal septations showing minimal contrast enhancement.
No enhancing soft tissue component seen. This remains stable since
previous study, consistent with an indeterminate Bosniak category 2F
lesion. No other lesions are seen involving either kidney. No
evidence of hydronephrosis.

Stomach/Bowel: Visualized portions within the abdomen are
unremarkable.

Vascular/Lymphatic: No pathologically enlarged lymph nodes
identified. No abdominal aortic aneurysm.

Other:  None.

Musculoskeletal:  No suspicious bone lesions identified.
IMPRESSION: Stable 1.7 cm indeterminate Bosniak category 2F cystic lesion in
midpole of left kidney. Recommend continued followup by abdomen MRI
without and with contrast in 12 months. This recommendation follows
ACR consensus guidelines: Management of the Incidental Renal Mass on
CT: A White Paper of the ACR Incidental Findings Committee. [HOSPITAL] 3387;[DATE].

Mild hepatic steatosis .

## 2018-02-23 NOTE — Progress Notes (Signed)
Cardiology Office Note    Date:  02/27/2018   ID:  Shannon Hawkins, DOB 05/01/1965, MRN [id number]  PCP:  Argentina Ponder, PA  Cardiologist: Jenkins Rouge, MD  No chief complaint on file.   History of Present Illness:   53 y.o. obese femal with HTN, DM-2 and edema. She has had atypical chest pain with normal cath in September 2017.  TTE January 2019 normal EF and diastolic function trivial MR   Patient was hospitalized at [facility] him 12/29/17 for chest pain and shortness of breath.  CT scan showed no evidence of pulmonary embolus pneumonia or acute cardiopulmonary process.  It did find a 6 mm right middle lobe pulmonary nodule recommend follow-up CT in 6-12 months.  BNP was normal at 147, troponin negative d-dimer normal WBC was elevated at 13, EKG without acute change.    Follow-up CBC by primary care was normal and she was referred to pulmonary for possible sleep apnea.Was given Iv lasix x1.   Seen in [facility] 02/11/18 atypical pain R/O stressed over mother being in ICU   Biggest issue has been LE dependant edema and dyspnea. She has asthma and sees lung doctor in [place] She works at [employer] and is in this area during week but lives near [place]   Past Medical History:  Diagnosis Date  . Cancer ([place])    FAMILY HISTORY OF BLADDER CANCER  . DM type 2 (diabetes mellitus, type 2) ([place])   . Edema   . GERD (gastroesophageal reflux disease)   . Gross hematuria   . HTN (hypertension)   . Urinary frequency     Past Surgical History:  Procedure Laterality Date  . CARDIAC CATHETERIZATION N/A 09/12/2015   Procedure: Left Heart Cath and Coronary Angiography;  Surgeon: Nelva Bush, MD;  Location: [facility];  Service: Cardiovascular;  Laterality: N/A;  . CESAREAN SECTION  1987& 1989  . CHOLECYSTECTOMY, LAPAROSCOPIC  1988  . hystrodectomy  2009  . tonselectomy    . ulnar nerve dispo      Current Medications: Current Meds  Medication Sig  . aspirin  EC 81 MG tablet Take 1 tablet (81 mg total) by mouth daily.  . cetirizine-pseudoephedrine (ZYRTEC-D) 5-120 MG tablet Take 1 tablet by mouth daily. Reported on 06/26/2015  . Cholecalciferol (VITAMIN D3) 1000 units CAPS Take 1,000 Units by mouth daily.  . diazepam (VALIUM) 5 MG tablet Take 5 mg by mouth daily as needed.   . fluticasone furoate-vilanterol (BREO ELLIPTA) 200-25 MCG/INH AEPB Inhale 1 puff into the lungs daily.  . furosemide (LASIX) 20 MG tablet Take 20 mg by mouth daily.  Marland Kitchen losartan (COZAAR) 50 MG tablet Take 50 mg by mouth daily after breakfast.   . metFORMIN (GLUCOPHAGE) 500 MG tablet Take by mouth daily with breakfast.  . mirabegron ER (MYRBETRIQ) 25 MG TB24 tablet Take 25 mg by mouth every morning.  . pantoprazole (PROTONIX) 40 MG tablet Take 40 mg by mouth every morning.   . potassium chloride (KLOR-CON) 8 MEQ tablet Take 8 mEq by mouth daily.     Allergies:   Cefadroxil   Social History   Socioeconomic History  . Marital status: Married    Spouse name: Not on file  . Number of children: Not on file  . Years of education: Not on file  . Highest education level: Not on file  Occupational History  . Not on file  Social Needs  . Financial resource strain: Not on file  . Food  insecurity:    Worry: Not on file    Inability: Not on file  . Transportation needs:    Medical: Not on file    Non-medical: Not on file  Tobacco Use  . Smoking status: Never Smoker  . Smokeless tobacco: Never Used  Substance and Sexual Activity  . Alcohol use: No    Alcohol/week: 0.0 standard drinks  . Drug use: No  . Sexual activity: Yes    Partners: Female  Lifestyle  . Physical activity:    Days per week: Not on file    Minutes per session: Not on file  . Stress: Not on file  Relationships  . Social connections:    Talks on phone: Not on file    Gets together: Not on file    Attends religious service: Not on file    Active member of club or organization: Not on file    Attends  meetings of clubs or organizations: Not on file    Relationship status: Not on file  Other Topics Concern  . Not on file  Social History Narrative  . Not on file     Family History:  The patient's family history includes Atrial fibrillation in her mother; Bladder Cancer in her unknown relative; CVA in her brother; Cancer in her father; Heart attack (age of onset: 71) in her brother; Hypertension in her mother.   ROS:   Please see the history of present illness.    Review of Systems  Constitution: Positive for malaise/fatigue.  HENT: Negative.   Cardiovascular: Positive for leg swelling.  Respiratory: Negative.   Endocrine: Negative.   Hematologic/Lymphatic: Negative.   Musculoskeletal: Negative.   Gastrointestinal: Negative.   Genitourinary: Negative.   Neurological: Positive for headaches.   All other systems reviewed and are negative.   PHYSICAL EXAM:   VS:  BP 124/84   Pulse 90   Ht 5\' 4"  (1.626 m)   Wt 121.6 kg   SpO2 96%   BMI 46.00 kg/m   Physical Exam  Affect appropriate Obese  HEENT: normal Neck supple with no adenopathy JVP normal no bruits no thyromegaly Lungs clear with no wheezing and good diaphragmatic motion Heart:  S1/S2 no murmur, no rub, gallop or click PMI normal Abdomen: benighn, BS positve, no tenderness, no AAA no bruit.  No HSM or HJR Distal pulses intact with no bruits No edema Neuro non-focal Skin warm and dry No muscular weakness   Wt Readings from Last 3 Encounters:  02/27/18 121.6 kg  02/09/17 117.9 kg  01/19/17 118.8 kg      Studies/Labs Reviewed:    EKG:   21-Apr-2016 SR rate 86 low voltage otherwise normal    Recent Labs: No results found for requested labs within last 8760 hours.   Lipid Panel No results found for: CHOL, TRIG, HDL, CHOLHDL, VLDL, LDLCALC, LDLDIRECT  Additional studies/ records that were reviewed today include:  MR of the abdomen 07/29/16 IMPRESSION: Stable 1.7 cm indeterminate Bosniak category 48F cystic  lesion in midpole of left kidney. Recommend continued followup by abdomen MRI without and with contrast in 12 months. This recommendation follows ACR consensus guidelines: Management of the Incidental Renal Mass on CT: A White Paper of the ACR Incidental Findings Committee. J Am Coll Radiol [phone].   Mild hepatic steatosis .     2D ECHO: 08/26/2015 LV EF: 60% -   65% Study Conclusions - Left ventricle: The cavity size was normal. Wall thickness was   increased in a pattern  of mild LVH. Systolic function was normal.   The estimated ejection fraction was in the range of 60% to 65%.   Wall motion was normal; there were no regional wall motion   abnormalities. Left ventricular diastolic function parameters   were normal. - Aortic valve: Mildly calcified annulus. Trileaflet; normal   thickness leaflets. Valve area (VTI): 2.06 cm^2. Valve area   (Vmax): 1.83 cm^2. Valve area (Vmean): 1.82 cm^2. - Mitral valve: Mildly calcified annulus. Normal thickness leaflets - Atrial septum: No defect or patent foramen ovale was identified. - Technically difficult study. Echocontrast was used to enhance   visualization.   LHC 09/12/15 Conclusion  1. 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  The angiographic appearance of the disease does not correlate with the stress test findings. 2. Normal to hyperdynamic left ventricular contraction with upper normal filling pressure.   Recommendations: 1. Medical therapy and evaluation for other potential causes of the patient's chest pain, palpitations, and shortness of breath. 2. Will initiate atorvastatin 40 mg daily to prevent progression of CAD.  LDL in 04/2015 was 118. 3. Follow-up with Dr. Johnsie Cancel as an outpatient.      2D echo 01/21/17 study Conclusions   - Left ventricle: The cavity size was normal. Wall thickness was   normal. Systolic function was normal. The estimated ejection   fraction was in the range of 55%  to 60%. Wall motion was normal;   there were no regional wall motion abnormalities. Left   ventricular diastolic function parameters were normal. - Aortic valve: Mildly calcified annulus. Probably trileaflet. - Mitral valve: There was trivial regurgitation. - Right atrium: Central venous pressure (est): 3 mm Hg. - Atrial septum: No defect or patent foramen ovale was identified. - Tricuspid valve: There was trivial regurgitation. - Pulmonary arteries: Systolic pressure could not be accurately   estimated. - Pericardium, extracardiac: A prominent pericardial fat pad was   present.   Impressions:   - Normal LV wall thickness with LVEF 55-60% and normal diastolic   function. Trivial mitral regurgitation. Mildly calcified aortic   annulus. Trivial tricuspid regurgitation.     PLAN:  In order of problems listed above:  Edema:  From obesity  2D echo 01/21/17  shows normal LV function and normal diastolic function with trivial  MR.  Continue weight loss program and 2 g sodium diet.   Lasix 20 mg daily with extra dose as needed   HTN:  Well controlled.  Continue current medications and low sodium Dash type diet.    DM-2:  Discussed low carb diet.  Target hemoglobin A1c is 6.5 or less.  Continue current medications.   Chest pain 12/29/16 hospitalized in the [place] negative for PE, pneumonia or acute cardiopulmonary process.  Does have a 6 mm right middle lobe pulmonary nodule needs follow-up on CT in 6-12 months. prior normal cath in 09/2015 no need for repeat stress testing   Dyspnea :  Likely functional from obesity and asthma. CTA recently in [place] negative for PE  Will update Echo since it's been a year assess RV/LV function no murmurs on exam   Medication Adjustments/Labs and Tests Ordered: Current medicines are reviewed at length with the patient today.  Concerns regarding medicines are outlined above.  Medication changes, Labs and Tests ordered today are listed in  the Patient Instructions below. Patient Instructions  Medication Instructions:  Your physician recommends that you continue on your current medications as directed. Please refer to the  Current Medication list given to you today. If you need a refill on your cardiac medications before your next appointment, please call your pharmacy.   Lab work: NONE   If you have labs (blood work) drawn today and your tests are completely normal, you will receive your results only by: Marland Kitchen MyChart Message (if you have MyChart) OR . A paper copy in the mail If you have any lab test that is abnormal or we need to change your treatment, we will call you to review the results.  Testing/Procedures: Your physician has requested that you have an echocardiogram. Echocardiography is a painless test that uses sound waves to create images of your heart. It provides your doctor with information about the size and shape of your heart and how well your heart's chambers and valves are working. This procedure takes approximately one hour. There are no restrictions for this procedure.    Follow-Up: At [facility], you and your health needs are our priority.  As part of our continuing mission to provide you with exceptional heart care, we have created designated Provider Care Teams.  These Care Teams include your primary Cardiologist (physician) and Advanced Practice Providers (APPs -  Physician Assistants and Nurse Practitioners) who all work together to provide you with the care you need, when you need it. You will need a follow up appointment in 1 years.  Please call our office 2 months in advance to schedule this appointment.  You may see Jenkins Rouge, MD or one of the following Advanced Practice Providers on your designated Care Team:   Bernerd Pho, PA-C [facility]) . Ermalinda Barrios, PA-C ([place])  Any Other Special Instructions Will Be Listed Below (If Applicable). Thank you for choosing [facility]!        Signed, Jenkins Rouge, MD  02/27/2018 3:27 PM    [facility] [place], [place],   [address] Phone: [phone]; Fax: [phone]

## 2018-02-27 ENCOUNTER — Encounter: Payer: Self-pay | Admitting: Cardiovascular Disease

## 2018-02-27 ENCOUNTER — Ambulatory Visit: Payer: Managed Care, Other (non HMO) | Admitting: Cardiovascular Disease

## 2018-02-27 VITALS — BP 124/84 | HR 90 | Ht 64.0 in | Wt 268.0 lb

## 2018-02-27 DIAGNOSIS — I10 Essential (primary) hypertension: Secondary | ICD-10-CM | POA: Diagnosis not present

## 2018-02-27 DIAGNOSIS — R0602 Shortness of breath: Secondary | ICD-10-CM

## 2018-02-27 NOTE — Patient Instructions (Signed)
Medication Instructions:  Your physician recommends that you continue on your current medications as directed. Please refer to the Current Medication list given to you today.  If you need a refill on your cardiac medications before your next appointment, please call your pharmacy.   Lab work: NONE   If you have labs (blood work) drawn today and your tests are completely normal, you will receive your results only by: . MyChart Message (if you have MyChart) OR . A paper copy in the mail If you have any lab test that is abnormal or we need to change your treatment, we will call you to review the results.  Testing/Procedures: Your physician has requested that you have an echocardiogram. Echocardiography is a painless test that uses sound waves to create images of your heart. It provides your doctor with information about the size and shape of your heart and how well your heart's chambers and valves are working. This procedure takes approximately one hour. There are no restrictions for this procedure.    Follow-Up: At [facility], you and your health needs are our priority.  As part of our continuing mission to provide you with exceptional heart care, we have created designated Provider Care Teams.  These Care Teams include your primary Cardiologist (physician) and Advanced Practice Providers (APPs -  Physician Assistants and Nurse Practitioners) who all work together to provide you with the care you need, when you need it. You will need a follow up appointment in 1 years.  Please call our office 2 months in advance to schedule this appointment.  You may see Peter Nishan, MD or one of the following Advanced Practice Providers on your designated Care Team:   Brittany Strader, PA-C ([place] [facility]) . Michele Lenze, PA-C ([place] Office)  Any Other Special Instructions Will Be Listed Below (If Applicable). Thank you for choosing [facility]!     

## 2018-03-06 ENCOUNTER — Inpatient Hospital Stay (HOSPITAL_COMMUNITY): Admission: RE | Admit: 2018-03-06 | Payer: Managed Care, Other (non HMO) | Source: Ambulatory Visit

## 2019-04-05 ENCOUNTER — Telehealth: Payer: Self-pay | Admitting: Cardiovascular Disease

## 2019-04-05 NOTE — Telephone Encounter (Signed)
Pt is having to move to [place] for work, will be find a new cardiologist there
